# Patient Record
Sex: Female | Born: 1960 | Race: White | Hispanic: No | Smoking: Former smoker
Health system: Southern US, Community
[De-identification: ages and names within clinical notes are randomized; demographics above are authoritative.]

## PROBLEM LIST (undated history)

## (undated) DIAGNOSIS — D649 Anemia, unspecified: Secondary | ICD-10-CM

## (undated) DIAGNOSIS — R011 Cardiac murmur, unspecified: Secondary | ICD-10-CM

## (undated) DIAGNOSIS — K219 Gastro-esophageal reflux disease without esophagitis: Secondary | ICD-10-CM

## (undated) DIAGNOSIS — R112 Nausea with vomiting, unspecified: Secondary | ICD-10-CM

## (undated) DIAGNOSIS — Z9889 Other specified postprocedural states: Secondary | ICD-10-CM

## (undated) DIAGNOSIS — F32A Depression, unspecified: Secondary | ICD-10-CM

## (undated) DIAGNOSIS — F329 Major depressive disorder, single episode, unspecified: Secondary | ICD-10-CM

## (undated) HISTORY — DX: Depression, unspecified: F32.A

## (undated) HISTORY — DX: Gastro-esophageal reflux disease without esophagitis: K21.9

## (undated) HISTORY — DX: Nausea with vomiting, unspecified: R11.2

## (undated) HISTORY — DX: Cardiac murmur, unspecified: R01.1

## (undated) HISTORY — DX: Major depressive disorder, single episode, unspecified: F32.9

## (undated) HISTORY — PX: MYOMECTOMY: SHX85

## (undated) HISTORY — DX: Anemia, unspecified: D64.9

## (undated) HISTORY — PX: SALPINGOOPHORECTOMY: SHX82

## (undated) HISTORY — PX: GYNECOLOGIC CRYOSURGERY: SHX857

## (undated) HISTORY — PX: WISDOM TOOTH EXTRACTION: SHX21

## (undated) HISTORY — DX: Other specified postprocedural states: Z98.890

---

## 2003-04-25 HISTORY — PX: ABDOMINAL HYSTERECTOMY: SHX81

## 2007-03-05 ENCOUNTER — Emergency Department (HOSPITAL_COMMUNITY): Admission: EM | Admit: 2007-03-05 | Discharge: 2007-03-05 | Payer: Self-pay | Admitting: Emergency Medicine

## 2007-06-07 ENCOUNTER — Ambulatory Visit: Payer: Self-pay | Admitting: Sports Medicine

## 2007-06-07 DIAGNOSIS — S86819A Strain of other muscle(s) and tendon(s) at lower leg level, unspecified leg, initial encounter: Secondary | ICD-10-CM

## 2007-06-07 DIAGNOSIS — S838X9A Sprain of other specified parts of unspecified knee, initial encounter: Secondary | ICD-10-CM

## 2007-06-08 DIAGNOSIS — D649 Anemia, unspecified: Secondary | ICD-10-CM

## 2007-08-28 ENCOUNTER — Ambulatory Visit: Payer: Self-pay | Admitting: Sports Medicine

## 2007-08-28 DIAGNOSIS — IMO0002 Reserved for concepts with insufficient information to code with codable children: Secondary | ICD-10-CM

## 2008-08-26 ENCOUNTER — Ambulatory Visit: Payer: Self-pay | Admitting: Sports Medicine

## 2008-08-26 DIAGNOSIS — M2480 Other specific joint derangements of unspecified joint, not elsewhere classified: Secondary | ICD-10-CM

## 2008-08-26 DIAGNOSIS — M79609 Pain in unspecified limb: Secondary | ICD-10-CM

## 2011-01-31 LAB — I-STAT 8, (EC8 V) (CONVERTED LAB)
Acid-base deficit: 2
Glucose, Bld: 87
HCT: 35 — ABNORMAL LOW
TCO2: 24

## 2011-01-31 LAB — CBC
Hemoglobin: 11.8 — ABNORMAL LOW
MCHC: 34.6
Platelets: 258
RBC: 3.5 — ABNORMAL LOW
RDW: 13.5
WBC: 4.8

## 2011-01-31 LAB — DIFFERENTIAL
Eosinophils Absolute: 0 — ABNORMAL LOW
Eosinophils Relative: 0
Lymphs Abs: 1.6
Monocytes Absolute: 0.4
Monocytes Relative: 8
Neutrophils Relative %: 58

## 2011-01-31 LAB — POCT CARDIAC MARKERS
CKMB, poc: <1 — ABNORMAL LOW
Myoglobin, poc: 43.3
Operator id: 272551

## 2011-01-31 LAB — D-DIMER, QUANTITATIVE: D-Dimer, Quant: 0.38

## 2018-04-21 NOTE — Progress Notes (Deleted)
Julie Hopkins is a 57 y.o. female is here to [facility].   No care team member to display   History of Present Illness:   {CMA SCRIBE ATTESTATION}  HPI:  There are no preventive care reminders to display for this patient. No flowsheet data found.  PMHx, SurgHx, SocialHx, Medications, and Allergies were reviewed in the Visit Navigator and updated as appropriate.   No past medical history on file.  *** The histories are not reviewed yet. Please review them in the "History" navigator section and refresh this [place].  No family history on file.  Social History   Tobacco Use  . Smoking status: Not on file  Substance Use Topics  . Alcohol use: Not on file  . Drug use: Not on file    Current Medications and Allergies:  No current outpatient medications on file.  Allergies not on file Review of Systems:   Pertinent items are noted in the HPI. Otherwise, a complete ROS is negative.  Vitals:  There were no vitals filed for this visit.   There is no height or weight on file to calculate BMI.  Physical Exam:   Physical Exam  Results for orders placed or performed during the hospital encounter of 03/05/07  CBC  Result Value Ref Range   WBC      4.8 **Please note change in CBC/Diff reference range**   RBC 3.50 (L)    Hemoglobin 11.8 (L)    HCT 34.1 (L)    MCV 97.4    MCHC 34.6    RDW 13.5    Platelets 258   Differential  Result Value Ref Range   Neutrophils Relative % 58    Neutro Abs 2.8    Lymphocytes Relative 33    Lymphs Abs 1.6    Monocytes Relative 8    Monocytes Absolute 0.4    Eosinophils Relative 0    Eosinophils Absolute 0.0 (L)    Basophils Relative 1    Basophils Absolute 0.0   D-dimer, quantitative  Result Value Ref Range   D-Dimer, Quant      0.38        AT THE INHOUSE ESTABLISHED CUTOFF VALUE OF 0.48 ug/mL FEU, THIS ASSAY HAS BEEN DOCUMENTED IN THE LITERATURE TO HAVE  Cardiac markers, POC  Result Value Ref Range   Operator  id [id number]    Myoglobin, poc 43.3    CKMB, poc <1.0 (L)    Troponin i, poc <0.05   I-STAT creatinine  Result Value Ref Range   Operator id [phone] [id number]    Creatinine, Ser 1.2   I-stat 8 (EC8 V)  Result Value Ref Range   Operator id [id number]    Sodium 142    Potassium 3.8    Chloride 109    BUN 13    Glucose, Bld 87    pH, Ven 7.392 (H)    pCO2, Ven 36.9 (L)    Bicarbonate 22.4    TCO2 24    Acid-base deficit 2.0    HCT 35.0 (L)    Hemoglobin 11.9 (L)    Sample type VENOUS     Assessment and Plan:   There are no diagnoses linked to this encounter.  . Orders and follow up as documented in [place], reviewed diet, exercise and weight control, cardiovascular risk and specific lipid/LDL goals reviewed, reviewed medications and side effects in detail.  . Reviewed expectations re: course of current medical issues. . Outlined signs and symptoms indicating need for  more acute intervention. . Patient verbalized understanding and all questions were answered. . Patient received an After Visit Summary.  *** CMA served as Education administrator during this visit. History, Physical, and Plan performed by medical provider. The above documentation has been reviewed and is accurate and complete. Briscoe Deutscher, D.O.   Briscoe Deutscher, DO [place], [facility] 04/21/2018

## 2018-04-22 ENCOUNTER — Ambulatory Visit: Payer: Self-pay | Admitting: Family Medicine

## 2018-04-28 NOTE — Progress Notes (Signed)
Julie Hopkins is a 58 y.o. female is here to [facility].   Patient Care Team: Briscoe Deutscher, DO as PCP - General (Family Medicine)   History of Present Illness:   HPI:   1. Anxiety. Moved from [place]. Daughter in college. Husband had a liver transplant 2/2 ETOH cirrhosis. Drinking again. Living in [place] while remodeling new home. Able to fall asleep but if she wakes, she cannot go back to sleep due to worrying. Healthy diet. Tries to exercise. Weigh has been stable. No drugs or ETOH.   2. Allergies. Since moving to [place]. Not on treatment. Tried Benadryl - too sedating, Zyrtec - too sedating, Sudafed - palpitations.   3. Hx of ADD. Saw Psychologist. Brought in paperwork and will scan into chart. Wellbutrin has been helpful for this.   Health Maintenance Due  Topic Date Due  . Hepatitis C Screening  02[birth year]02/1959  . HIV Screening  02/21/1974  . TETANUS/TDAP  02/21/1978   No flowsheet data found.  PMHx, SurgHx, SocialHx, Medications, and Allergies were reviewed in the Visit Navigator and updated as appropriate.   Past Medical History:  Diagnosis Date  . Depression   . GERD (gastroesophageal reflux disease)   . Heart murmur   . UTI (urinary tract infection)    Past Surgical History:  Procedure Laterality Date  . ABDOMINAL HYSTERECTOMY  2005   Family History  Problem Relation Age of Onset  . Cancer Mother   . COPD Mother   . Early death Mother   . Heart disease Mother   . Miscarriages / [place] Mother   . Alcohol abuse Father   . Depression Father        "boderline" per pt  . Alcohol abuse Brother   . Early death Brother   . Heart attack Brother   . Hypertension Brother   . Early death Paternal Grandfather   . Depression Daughter   . Alcohol abuse Brother   . Depression Brother   . Heart disease Brother     Social History   Tobacco Use  . Smoking status: Never Smoker  . Smokeless tobacco: Never Used  Substance Use Topics  . Alcohol use: Yes   Alcohol/week: 1.0 - 2.0 standard drinks    Types: 1 - 2 Glasses of wine per week  . Drug use: Never    Current Medications and Allergies:   .  buPROPion (WELLBUTRIN XL) 300 MG 24 hr tablet, Take by mouth., Disp: , Rfl:  .  estradiol (ESTRACE) 1 MG tablet, Take by mouth., Disp: , Rfl:   Allergies  Allergen Reactions  . Sulfa Antibiotics Palpitations    Pt's mother said she was always allergic to it. Never taken med.    Review of Systems:   Pertinent items are noted in the HPI. Otherwise, a complete ROS is negative.  Vitals:   Vitals:   04/29/18 1204  BP: 100/68  Pulse: (!) 59  SpO2: 99%  Weight: 160 lb 9.6 oz (72.8 kg)     Body mass index is 24.42 kg/m.  Physical Exam:   Physical Exam Vitals signs and nursing note reviewed.  HENT:     Head: Normocephalic and atraumatic.  Eyes:     Pupils: Pupils are equal, round, and reactive to light.  Neck:     Musculoskeletal: Normal range of motion and neck supple.  Cardiovascular:     Rate and Rhythm: Normal rate and regular rhythm.     Heart sounds: Normal heart sounds.  Pulmonary:     Effort: Pulmonary effort is normal.  Abdominal:     Palpations: Abdomen is soft.  Skin:    General: Skin is warm.  Psychiatric:        Behavior: Behavior normal.    Assessment and Plan:   [name] was seen today for establish care.  Diagnoses and all orders for this visit:  Depression, recurrent ([place]) -     CBC w/Diff; Future -     COMPLETE METABOLIC PANEL WITH GFR; Future -     Ambulatory referral to Psychology  Hair loss -     B12; Future -     CBC w/Diff; Future -     COMPLETE METABOLIC PANEL WITH GFR; Future -     TSH; Future  Screening for HIV (human immunodeficiency virus) -     HIV Antibody (routine testing w rflx); Future  Need for hepatitis C screening test -     Hepatitis C Antibody; Future  Screening cholesterol level -     Lipid Panel; Future  Need for immunization against influenza -     Flu Vaccine QUAD  36+ mos IM  Primary insomnia -     traZODone (DESYREL) 50 MG tablet; Take 0.5-1 tablets (25-50 mg total) by mouth at bedtime as needed for sleep.  Seasonal allergic rhinitis, unspecified trigger -     fluticasone (FLONASE) 50 MCG/ACT nasal spray; Place 2 sprays into both nostrils daily.  Situational anxiety -     Ambulatory referral to Psychology  Fatigue, unspecified type -     B12; Future -     CBC w/Diff; Future -     COMPLETE METABOLIC PANEL WITH GFR; Future -     TSH; Future    . Orders and follow up as documented in [place], reviewed diet, exercise and weight control, cardiovascular risk and specific lipid/LDL goals reviewed, reviewed medications and side effects in detail.  . Reviewed expectations re: course of current medical issues. . Outlined signs and symptoms indicating need for more acute intervention. . Patient verbalized understanding and all questions were answered. . Patient received an After Visit Summary.  Briscoe Deutscher, DO [place], [place] 04/29/2018

## 2018-04-29 ENCOUNTER — Encounter: Payer: Self-pay | Admitting: Family Medicine

## 2018-04-29 ENCOUNTER — Ambulatory Visit: Payer: Managed Care, Other (non HMO) | Admitting: Family Medicine

## 2018-04-29 VITALS — BP 100/68 | HR 59 | Wt 160.6 lb

## 2018-04-29 DIAGNOSIS — Z114 Encounter for screening for human immunodeficiency virus [HIV]: Secondary | ICD-10-CM | POA: Diagnosis not present

## 2018-04-29 DIAGNOSIS — L659 Nonscarring hair loss, unspecified: Secondary | ICD-10-CM | POA: Insufficient documentation

## 2018-04-29 DIAGNOSIS — F339 Major depressive disorder, recurrent, unspecified: Secondary | ICD-10-CM | POA: Insufficient documentation

## 2018-04-29 DIAGNOSIS — F418 Other specified anxiety disorders: Secondary | ICD-10-CM | POA: Insufficient documentation

## 2018-04-29 DIAGNOSIS — R5383 Other fatigue: Secondary | ICD-10-CM

## 2018-04-29 DIAGNOSIS — Z1159 Encounter for screening for other viral diseases: Secondary | ICD-10-CM | POA: Diagnosis not present

## 2018-04-29 DIAGNOSIS — Z13220 Encounter for screening for lipoid disorders: Secondary | ICD-10-CM

## 2018-04-29 DIAGNOSIS — J302 Other seasonal allergic rhinitis: Secondary | ICD-10-CM | POA: Insufficient documentation

## 2018-04-29 DIAGNOSIS — Z23 Encounter for immunization: Secondary | ICD-10-CM | POA: Diagnosis not present

## 2018-04-29 DIAGNOSIS — F5101 Primary insomnia: Secondary | ICD-10-CM | POA: Insufficient documentation

## 2018-04-29 MED ORDER — TRAZODONE HCL 50 MG PO TABS
25.0000 mg | ORAL_TABLET | Freq: Every evening | ORAL | 3 refills | Status: DC | PRN
Start: 2018-04-29 — End: 2018-10-04

## 2018-04-29 MED ORDER — FLUTICASONE PROPIONATE 50 MCG/ACT NA SUSP: 2.0000 | Freq: Every day | NASAL | 6 refills | Status: DC

## 2018-04-30 ENCOUNTER — Other Ambulatory Visit (INDEPENDENT_AMBULATORY_CARE_PROVIDER_SITE_OTHER): Payer: Managed Care, Other (non HMO)

## 2018-04-30 DIAGNOSIS — Z13220 Encounter for screening for lipoid disorders: Secondary | ICD-10-CM | POA: Diagnosis not present

## 2018-04-30 DIAGNOSIS — F339 Major depressive disorder, recurrent, unspecified: Secondary | ICD-10-CM | POA: Diagnosis not present

## 2018-04-30 DIAGNOSIS — L659 Nonscarring hair loss, unspecified: Secondary | ICD-10-CM | POA: Diagnosis not present

## 2018-04-30 DIAGNOSIS — Z1159 Encounter for screening for other viral diseases: Secondary | ICD-10-CM

## 2018-04-30 DIAGNOSIS — R5383 Other fatigue: Secondary | ICD-10-CM | POA: Diagnosis not present

## 2018-04-30 DIAGNOSIS — Z114 Encounter for screening for human immunodeficiency virus [HIV]: Secondary | ICD-10-CM

## 2018-04-30 LAB — LIPID PANEL
Cholesterol: 238 mg/dL — ABNORMAL HIGH (ref 0–200)
HDL: 80.4 mg/dL (ref >39.00)
LDL Cholesterol: 141 mg/dL — ABNORMAL HIGH (ref 0–99)
NonHDL: 157.61
Total CHOL/HDL Ratio: 3
Triglycerides: 81 mg/dL (ref 0.0–149.0)
VLDL: 16.2 mg/dL (ref 0.0–40.0)

## 2018-04-30 LAB — CBC WITH DIFFERENTIAL/PLATELET
Basophils Absolute: 0 10*3/uL (ref 0.0–0.1)
Basophils Relative: 0.8 % (ref 0.0–3.0)
Eosinophils Absolute: 0 10*3/uL (ref 0.0–0.7)
Eosinophils Relative: 0.7 % (ref 0.0–5.0)
HCT: 39.3 % (ref 36.0–46.0)
Hemoglobin: 13.3 g/dL (ref 12.0–15.0)
Lymphocytes Relative: 33.2 % (ref 12.0–46.0)
Lymphs Abs: 1.1 10*3/uL (ref 0.7–4.0)
MCHC: 33.9 g/dL (ref 30.0–36.0)
MCV: 96.3 fl (ref 78.0–100.0)
Monocytes Absolute: 0.3 10*3/uL (ref 0.1–1.0)
Monocytes Relative: 8.8 % (ref 3.0–12.0)
Neutro Abs: 1.8 10*3/uL (ref 1.4–7.7)
Neutrophils Relative %: 56.5 % (ref 43.0–77.0)
Platelets: 208 10*3/uL (ref 150.0–400.0)
RBC: 4.08 Mil/uL (ref 3.87–5.11)
RDW: 13.1 % (ref 11.5–15.5)
WBC: 3.2 10*3/uL — ABNORMAL LOW (ref 4.0–10.5)

## 2018-04-30 LAB — VITAMIN B12: Vitamin B-12: 309 pg/mL (ref 211–911)

## 2018-04-30 LAB — TSH: TSH: 1.53 u[IU]/mL (ref 0.35–4.50)

## 2018-05-01 ENCOUNTER — Other Ambulatory Visit: Payer: Managed Care, Other (non HMO)

## 2018-05-01 ENCOUNTER — Other Ambulatory Visit: Payer: Self-pay

## 2018-05-01 DIAGNOSIS — D729 Disorder of white blood cells, unspecified: Secondary | ICD-10-CM

## 2018-05-01 LAB — COMPLETE METABOLIC PANEL WITH GFR
AG Ratio: 2 (calc) (ref 1.0–2.5)
ALT: 10 U/L (ref 6–29)
AST: 17 U/L (ref 10–35)
Albumin: 4.1 g/dL (ref 3.6–5.1)
Alkaline phosphatase (APISO): 40 U/L (ref 33–130)
BUN: 13 mg/dL (ref 7–25)
CO2: 29 mmol/L (ref 20–32)
Calcium: 9.1 mg/dL (ref 8.6–10.4)
Chloride: 102 mmol/L (ref 98–110)
Creat: 0.84 mg/dL (ref 0.50–1.05)
GFR, Est African American: 88 mL/min/{1.73_m2} (ref >=60)
GFR, Est Non African American: 76 mL/min/{1.73_m2} (ref >=60)
Globulin: 2.1 g/dL (calc) (ref 1.9–3.7)
Glucose, Bld: 84 mg/dL (ref 65–99)
Potassium: 4.3 mmol/L (ref 3.5–5.3)
Sodium: 137 mmol/L (ref 135–146)
Total Bilirubin: 0.6 mg/dL (ref 0.2–1.2)
Total Protein: 6.2 g/dL (ref 6.1–8.1)

## 2018-05-01 LAB — HEPATITIS C ANTIBODY
Hepatitis C Ab: NONREACTIVE
SIGNAL TO CUT-OFF: 0.01 (ref <1.00)

## 2018-05-01 LAB — HIV ANTIBODY (ROUTINE TESTING W REFLEX): HIV 1&2 Ab, 4th Generation: NONREACTIVE

## 2018-05-02 ENCOUNTER — Telehealth: Payer: Self-pay | Admitting: Family Medicine

## 2018-05-02 NOTE — Telephone Encounter (Signed)
This has been taking care of.

## 2018-05-02 NOTE — Telephone Encounter (Signed)
See note  Copied from [place] [phone] [id number]. Topic: Quick Communication - Lab Results (Clinic Use ONLY) >> May 02, 2018  8:52 AM Scherrie Gerlach wrote: Pt states Julie Hopkins called her and she is returning the call again for lab results.

## 2018-05-28 ENCOUNTER — Telehealth: Payer: Self-pay | Admitting: Family Medicine

## 2018-05-28 NOTE — Telephone Encounter (Signed)
Ok for pt to use same day?  Copied from [place] [id number]. Topic: Appointment Scheduling - Scheduling Inquiry for Clinic >> May 28, 2018  2:06 PM Scherrie Gerlach wrote: Reason for CRM: pt has pain with urination and some pain when she bends over.  Requesting to see Dr Juleen China on Wed.

## 2018-05-28 NOTE — Telephone Encounter (Signed)
Called only number on chart not able to reach patient. L/m on voice mail.

## 2018-05-30 ENCOUNTER — Other Ambulatory Visit (INDEPENDENT_AMBULATORY_CARE_PROVIDER_SITE_OTHER): Payer: Managed Care, Other (non HMO)

## 2018-05-30 DIAGNOSIS — D729 Disorder of white blood cells, unspecified: Secondary | ICD-10-CM | POA: Diagnosis not present

## 2018-05-30 LAB — CBC WITH DIFFERENTIAL/PLATELET
Basophils Absolute: 0 10*3/uL (ref 0.0–0.1)
Basophils Relative: 0.9 % (ref 0.0–3.0)
Eosinophils Absolute: 0.1 10*3/uL (ref 0.0–0.7)
Eosinophils Relative: 3.2 % (ref 0.0–5.0)
HCT: 37.7 % (ref 36.0–46.0)
Hemoglobin: 12.9 g/dL (ref 12.0–15.0)
Lymphocytes Relative: 30.1 % (ref 12.0–46.0)
Lymphs Abs: 1.1 10*3/uL (ref 0.7–4.0)
MCHC: 34.3 g/dL (ref 30.0–36.0)
MCV: 96.2 fl (ref 78.0–100.0)
Monocytes Absolute: 0.5 10*3/uL (ref 0.1–1.0)
Monocytes Relative: 13.2 % — ABNORMAL HIGH (ref 3.0–12.0)
Neutro Abs: 1.8 10*3/uL (ref 1.4–7.7)
Neutrophils Relative %: 52.6 % (ref 43.0–77.0)
Platelets: 211 10*3/uL (ref 150.0–400.0)
RBC: 3.92 Mil/uL (ref 3.87–5.11)
RDW: 12.8 % (ref 11.5–15.5)
WBC: 3.5 10*3/uL — ABNORMAL LOW (ref 4.0–10.5)

## 2018-05-30 NOTE — Telephone Encounter (Signed)
Called patient not able to reach?  

## 2018-05-31 NOTE — Telephone Encounter (Signed)
[place] letter.

## 2018-05-31 NOTE — Telephone Encounter (Signed)
Have called patient x 3 not able to reach ok to mail letter?

## 2018-06-03 ENCOUNTER — Other Ambulatory Visit: Payer: Self-pay | Admitting: *Deleted

## 2018-06-03 DIAGNOSIS — D729 Disorder of white blood cells, unspecified: Secondary | ICD-10-CM

## 2018-06-03 NOTE — Telephone Encounter (Signed)
Spoke to pt told her we have tried to get in touch with her regarding call last week with urinary symptoms. Asked her if she is still having urinary symptoms? Pt said no she is fine, it went away right away, if I develop symptoms again will call. Told pt okay that is fine.

## 2018-06-19 ENCOUNTER — Encounter: Payer: Self-pay | Admitting: Family Medicine

## 2018-06-19 DIAGNOSIS — F339 Major depressive disorder, recurrent, unspecified: Secondary | ICD-10-CM

## 2018-06-19 MED ORDER — BUPROPION HCL ER (XL) 300 MG PO TB24: 300.0000 mg | ORAL_TABLET | Freq: Every day | ORAL | 0 refills | Status: DC

## 2018-06-19 NOTE — Telephone Encounter (Signed)
Dr. Juleen China, pt would like a refill on Wellbutrin XR 300 mg, you have not filled for pt. I have loaded Rx.

## 2018-07-09 ENCOUNTER — Telehealth: Payer: Self-pay | Admitting: Internal Medicine

## 2018-07-09 NOTE — Telephone Encounter (Signed)
A new hem appt has been scheduled for the pt to see Dr. Walden Field on 3/18 at 1050am. She's been made aware to arrive 15 minutes early.

## 2018-07-10 ENCOUNTER — Telehealth: Payer: Self-pay | Admitting: Emergency Medicine

## 2018-07-10 ENCOUNTER — Inpatient Hospital Stay: Payer: Managed Care, Other (non HMO) | Attending: Internal Medicine | Admitting: Internal Medicine

## 2018-07-10 ENCOUNTER — Telehealth: Payer: Self-pay | Admitting: Internal Medicine

## 2018-07-10 ENCOUNTER — Inpatient Hospital Stay: Payer: Managed Care, Other (non HMO)

## 2018-07-10 ENCOUNTER — Other Ambulatory Visit: Payer: Self-pay

## 2018-07-10 VITALS — BP 129/80 | HR 54 | Temp 97.5°F | Resp 17 | Ht 68.0 in | Wt 168.9 lb

## 2018-07-10 DIAGNOSIS — M25551 Pain in right hip: Secondary | ICD-10-CM | POA: Diagnosis not present

## 2018-07-10 DIAGNOSIS — K219 Gastro-esophageal reflux disease without esophagitis: Secondary | ICD-10-CM | POA: Diagnosis not present

## 2018-07-10 DIAGNOSIS — D72818 Other decreased white blood cell count: Secondary | ICD-10-CM | POA: Diagnosis present

## 2018-07-10 DIAGNOSIS — Z79899 Other long term (current) drug therapy: Secondary | ICD-10-CM | POA: Insufficient documentation

## 2018-07-10 DIAGNOSIS — R102 Pelvic and perineal pain: Secondary | ICD-10-CM | POA: Diagnosis not present

## 2018-07-10 LAB — CMP (CANCER CENTER ONLY)
ALT: 18 U/L (ref 0–44)
AST: 19 U/L (ref 15–41)
Albumin: 4.1 g/dL (ref 3.5–5.0)
Alkaline Phosphatase: 60 U/L (ref 38–126)
Anion gap: 9 (ref 5–15)
BUN: 14 mg/dL (ref 6–20)
CO2: 28 mmol/L (ref 22–32)
Calcium: 9 mg/dL (ref 8.9–10.3)
Chloride: 105 mmol/L (ref 98–111)
Creatinine: 0.82 mg/dL (ref 0.44–1.00)
GFR, Est AFR Am: >60 mL/min (ref >60)
GFR, Estimated: >60 mL/min (ref >60)
Glucose, Bld: 82 mg/dL (ref 70–99)
Potassium: 3.9 mmol/L (ref 3.5–5.1)
Sodium: 142 mmol/L (ref 135–145)
Total Bilirubin: 0.4 mg/dL (ref 0.3–1.2)
Total Protein: 7.2 g/dL (ref 6.5–8.1)

## 2018-07-10 LAB — CBC WITH DIFFERENTIAL (CANCER CENTER ONLY)
Abs Immature Granulocytes: 0.01 10*3/uL (ref 0.00–0.07)
BASOS PCT: 1 %
Basophils Absolute: 0 10*3/uL (ref 0.0–0.1)
Eosinophils Absolute: 0.1 10*3/uL (ref 0.0–0.5)
Eosinophils Relative: 1 %
HCT: 38.1 % (ref 36.0–46.0)
Hemoglobin: 12.7 g/dL (ref 12.0–15.0)
IMMATURE GRANULOCYTES: 0 %
Lymphocytes Relative: 32 %
Lymphs Abs: 1.3 10*3/uL (ref 0.7–4.0)
MCH: 32.1 pg (ref 26.0–34.0)
MCHC: 33.3 g/dL (ref 30.0–36.0)
MCV: 96.2 fL (ref 80.0–100.0)
MONOS PCT: 7 %
Monocytes Absolute: 0.3 10*3/uL (ref 0.1–1.0)
NEUTROS PCT: 59 %
Neutro Abs: 2.4 10*3/uL (ref 1.7–7.7)
Platelet Count: 201 10*3/uL (ref 150–400)
RBC: 3.96 MIL/uL (ref 3.87–5.11)
RDW: 12.8 % (ref 11.5–15.5)
WBC Count: 4.1 10*3/uL (ref 4.0–10.5)
nRBC: 0 % (ref 0.0–0.2)

## 2018-07-10 LAB — LACTATE DEHYDROGENASE: LDH: 171 U/L (ref 98–192)

## 2018-07-10 LAB — C-REACTIVE PROTEIN: CRP: <0.8 mg/dL (ref <1.0)

## 2018-07-10 LAB — SEDIMENTATION RATE: Sed Rate: 6 mm/hr (ref 0–22)

## 2018-07-10 NOTE — Telephone Encounter (Signed)
Gave avs and calendar ° °

## 2018-07-10 NOTE — Telephone Encounter (Signed)
Called pt to make sure she was aware of appt today.  Delays in check in at this time.  Pt in lobby waiting to be registered.  MD Higgs made aware.

## 2018-07-10 NOTE — Progress Notes (Signed)
Pt recently traveled to [place] from 3/6 to 07/04/2018.  Asymptomatic.  MD Higgs aware.  Pt given mask.  Alone in room 24 for MD visit.

## 2018-07-10 NOTE — Progress Notes (Signed)
Referring Physician:  Briscoe Deutscher, DO.  [facility]  Diagnosis Other decreased white blood cell (WBC) count - Plan: CBC with Differential ([place]), CMP ([place] only), Sedimentation rate, C-reactive protein, Rheumatoid factor, ANA, IFA (with reflex), SPEP (Serum protein electrophoresis), Ambulatory referral to Gastroenterology, Lactate dehydrogenase (LDH)  Pain in joint involving right pelvic region and thigh - Plan: CBC with Differential ([place]), CMP ([place] only), Sedimentation rate, C-reactive protein, Rheumatoid factor, ANA, IFA (with reflex), SPEP (Serum protein electrophoresis), Ambulatory referral to Gastroenterology, Lactate dehydrogenase (LDH)  Staging Cancer Staging No matching staging information was found for the patient.  Assessment and Plan:  1. Leukopenia.  58 year old female referred for evaluation due to leukopenia.  Pt reports she has been living in hotel with husband during Architect.  She reports some joint problems and in the past saw a rheumatologist.  Pt recently traveled to [place] 3/6 to 3/12.  Pt had labs done 04/30/2018 that showed WBC 3.2 HB 13.3 plts `208,000.  Chemistries WNL with K+ 4.3 Cr 0.84 and normal LFTs.  Labs done 05/30/2018 reviewed and showed WBC 3.5 HB 12.9 plts 211,000.  She denies any fevers, chills, night sweats, diarrhea and has noted no adenopathy.  She denies any sick contacts.  Pt is seen today for consultation due to leukopenia.   Labs done today  07/10/2018 reviewed and showed WBC 4.1 HB 12.7 plts 201,000.  Chemistries WNL with K+ 3.9 Cr 0.82 normal LFTs.  LDH WNL  171.  Counts are WNL.  Suspect normal variant.  Awaiting results of ANA.  If all Labs negative will notify pt and arrange for follow-up in 10/2018  2.  Joint pain.  Pt reportedly was seen by Rheumatologist in the past.  Awaiting results of ANA, RF, SPEP, RF, CRP.  If negative, pt will follow-up in 10/2018.   3.  Health maintenance.  Pt reports  last colonoscopy was done 10 years ago.  She is referred to GI for evaluation.    30 minutes spent with more than 50% spent in review of records, counseling and coordination of care.    HPI:  58 year old female referred for evaluation due to leukopenia.  Pt reports she has been living in hotel with husband during [place].  She reports some joint problems and in the past saw a rheumatologist.  Pt recently traveled to [place] 3/6 to 3/12.  Pt had labs done 04/30/2018 that showed WBC 3.2 HB 13.3 plts `208,000.  Chemistries WNL with K+ 4.3 Cr 0.84 and normal LFTs.  Labs done 05/30/2018 reviewed and showed WBC 3.5 HB 12.9 plts 211,000.  She denies any fevers, chills, night sweats, diarrhea and has noted no adenopathy.  She denies any sick contacts.  Pt is seen today for consultation due to leukopenia.   Problem List Patient Active Problem List   Diagnosis Date Noted  . Depression, recurrent ([place]) [F33.9] 04/29/2018  . Hair loss [L65.9] 04/29/2018  . Primary insomnia [F51.01] 04/29/2018  . Seasonal allergic rhinitis [J30.2] 04/29/2018  . Situational anxiety [F41.8] 04/29/2018    Past Medical History Past Medical History:  Diagnosis Date  . Depression   . GERD (gastroesophageal reflux disease)   . Heart murmur   . UTI (urinary tract infection)     Past Surgical History Past Surgical History:  Procedure Laterality Date  . ABDOMINAL HYSTERECTOMY  2005    Family History Family History  Problem Relation Age of Onset  . Cancer Mother   .  COPD Mother   . Early death Mother   . Heart disease Mother   . Miscarriages / [place] Mother   . Alcohol abuse Father   . Depression Father        "boderline" per pt  . Alcohol abuse Brother   . Early death Brother   . Heart attack Brother   . Hypertension Brother   . Early death Paternal Grandfather   . Depression Daughter   . Alcohol abuse Brother   . Depression Brother   . Heart disease Brother      Social History  reports that she  has never smoked. She has never used smokeless tobacco. She reports current alcohol use of about 1.0 - 2.0 standard drinks of alcohol per week. She reports that she does not use drugs.  Medications  Current Outpatient Medications:  .  buPROPion (WELLBUTRIN XL) 300 MG 24 hr tablet, Take 1 tablet (300 mg total) by mouth daily., Disp: 90 tablet, Rfl: 0 .  estradiol (ESTRACE) 1 MG tablet, Take by mouth., Disp: , Rfl:  .  fluticasone (FLONASE) 50 MCG/ACT nasal spray, Place 2 sprays into both nostrils daily., Disp: 16 g, Rfl: 6 .  traZODone (DESYREL) 50 MG tablet, Take 0.5-1 tablets (25-50 mg total) by mouth at bedtime as needed for sleep., Disp: 30 tablet, Rfl: 3  Allergies Sulfa antibiotics  Review of Systems Review of Systems - Oncology ROS negative other than joint pain   Physical Exam  Vitals Wt Readings from Last 3 Encounters:  07/10/18 168 lb 14.4 oz (76.6 kg)  04/29/18 160 lb 9.6 oz (72.8 kg)   Temp Readings from Last 3 Encounters:  07/10/18 (!) 97.5 F (36.4 C) (Oral)   BP Readings from Last 3 Encounters:  07/10/18 129/80  04/29/18 100/68   Pulse Readings from Last 3 Encounters:  07/10/18 (!) 54  04/29/18 (!) 59   Constitutional: Well-developed, well-nourished, and in no distress.   HENT: Head: Normocephalic and atraumatic.  Mouth/Throat: No oropharyngeal exudate. Mucosa moist. Eyes: Pupils are equal, round, and reactive to light. Conjunctivae are normal. No scleral icterus.  Neck: Normal range of motion. Neck supple. No JVD present.  Cardiovascular: Normal rate, regular rhythm and normal heart sounds.  Exam reveals no gallop and no friction rub.   No murmur heard. Pulmonary/Chest: Effort normal and breath sounds normal. No respiratory distress. No wheezes.No rales.  Abdominal: Soft. Bowel sounds are normal. No distension. There is no tenderness. There is no guarding.  Musculoskeletal: No edema or tenderness.  Lymphadenopathy: No cervical,axillary or supraclavicular  adenopathy.  Neurological: Alert and oriented to person, place, and time. No cranial nerve deficit.  Skin: Skin is warm and dry. No rash noted. No erythema. No pallor.  Psychiatric: Affect and judgment normal.   Labs Appointment on 07/10/2018  Component Date Value Ref Range Status  . LDH 07/10/2018 171  98 - 192 U/L Final   Performed at [facility], [place] [address], [place], [place] [address]  . CRP 07/10/2018 <0.8  <1.0 mg/dL Final   Performed at [place] [address], [place], [place] [address]  . Sed Rate 07/10/2018 6  0 - 22 mm/hr Final   Performed at [facility], [place] [address], [place], [place] [address]  . Sodium 07/10/2018 142  135 - 145 mmol/L Final  . Potassium 07/10/2018 3.9  3.5 - 5.1 mmol/L Final  . Chloride 07/10/2018 105  98 - 111 mmol/L Final  . CO2  07/10/2018 28  22 - 32 mmol/L Final  . Glucose, Bld 07/10/2018 82  70 - 99 mg/dL Final  . BUN 07/10/2018 14  6 - 20 mg/dL Final  . Creatinine 07/10/2018 0.82  0.44 - 1.00 mg/dL Final  . Calcium 07/10/2018 9.0  8.9 - 10.3 mg/dL Final  . Total Protein 07/10/2018 7.2  6.5 - 8.1 g/dL Final  . Albumin 07/10/2018 4.1  3.5 - 5.0 g/dL Final  . AST 07/10/2018 19  15 - 41 U/L Final  . ALT 07/10/2018 18  0 - 44 U/L Final  . Alkaline Phosphatase 07/10/2018 60  38 - 126 U/L Final  . Total Bilirubin 07/10/2018 0.4  0.3 - 1.2 mg/dL Final  . GFR, Est Non Af Am 07/10/2018 >60  >60 mL/min Final  . GFR, Est AFR Am 07/10/2018 >60  >60 mL/min Final  . Anion gap 07/10/2018 9  5 - 15 Final   Performed at [facility], [place] [address] [place], [place] [address]  . WBC Count 07/10/2018 4.1  4.0 - 10.5 K/uL Final  . RBC 07/10/2018 3.96  3.87 - 5.11 MIL/uL Final  . Hemoglobin 07/10/2018 12.7  12.0 - 15.0 g/dL Final  . HCT 07/10/2018 38.1  36.0 - 46.0 % Final  . MCV 07/10/2018 96.2  80.0 - 100.0 fL Final  . MCH 07/10/2018 32.1  26.0 - 34.0 pg Final  .  MCHC 07/10/2018 33.3  30.0 - 36.0 g/dL Final  . RDW 07/10/2018 12.8  11.5 - 15.5 % Final  . Platelet Count 07/10/2018 201  150 - 400 K/uL Final  . nRBC 07/10/2018 0.0  0.0 - 0.2 % Final  . Neutrophils Relative % 07/10/2018 59  % Final  . Neutro Abs 07/10/2018 2.4  1.7 - 7.7 K/uL Final  . Lymphocytes Relative 07/10/2018 32  % Final  . Lymphs Abs 07/10/2018 1.3  0.7 - 4.0 K/uL Final  . Monocytes Relative 07/10/2018 7  % Final  . Monocytes Absolute 07/10/2018 0.3  0.1 - 1.0 K/uL Final  . Eosinophils Relative 07/10/2018 1  % Final  . Eosinophils Absolute 07/10/2018 0.1  0.0 - 0.5 K/uL Final  . Basophils Relative 07/10/2018 1  % Final  . Basophils Absolute 07/10/2018 0.0  0.0 - 0.1 K/uL Final  . Immature Granulocytes 07/10/2018 0  % Final  . Abs Immature Granulocytes 07/10/2018 0.01  0.00 - 0.07 K/uL Final   Performed at [facility], [place] [address] [place], [place] [address]     Pathology Orders Placed This Encounter  Procedures  . CBC with Differential (Cancer Center Only)    Standing Status:   Future    Number of Occurrences:   1    Standing Expiration Date:   07/10/2019  . CMP ([place] only)    Standing Status:   Future    Number of Occurrences:   1    Standing Expiration Date:   07/10/2019  . Sedimentation rate    Standing Status:   Future    Number of Occurrences:   1    Standing Expiration Date:   07/10/2019  . C-reactive protein    Standing Status:   Future    Number of Occurrences:   1    Standing Expiration Date:   07/10/2019  . Rheumatoid factor    Standing Status:   Future    Number of Occurrences:   1    Standing Expiration Date:   07/10/2019  .  ANA, IFA (with reflex)    Standing Status:   Future    Number of Occurrences:   1    Standing Expiration Date:   07/10/2019  . SPEP (Serum protein electrophoresis)    Standing Status:   Future    Number of Occurrences:   1    Standing Expiration Date:   07/10/2019  . Lactate dehydrogenase  (LDH)    Standing Status:   Future    Number of Occurrences:   1    Standing Expiration Date:   07/10/2019  . Ambulatory referral to Gastroenterology    Referral Priority:   Routine    Referral Type:   Consultation    Referral Reason:   Specialty Services Required    Number of Visits Requested:   1       Zoila Shutter MD

## 2018-07-11 LAB — PROTEIN ELECTROPHORESIS, SERUM
A/G Ratio: 1.7 (ref 0.7–1.7)
ALPHA-2-GLOBULIN: 0.6 g/dL (ref 0.4–1.0)
Albumin ELP: 4.1 g/dL (ref 2.9–4.4)
Alpha-1-Globulin: 0.2 g/dL (ref 0.0–0.4)
Beta Globulin: 0.9 g/dL (ref 0.7–1.3)
Gamma Globulin: 0.8 g/dL (ref 0.4–1.8)
Globulin, Total: 2.4 g/dL (ref 2.2–3.9)
Total Protein ELP: 6.5 g/dL (ref 6.0–8.5)

## 2018-07-11 LAB — RHEUMATOID FACTOR: Rheumatoid fact SerPl-aCnc: <10 IU/mL (ref 0.0–13.9)

## 2018-07-11 LAB — ANTINUCLEAR ANTIBODIES, IFA: ANA Ab, IFA: NEGATIVE

## 2018-07-18 ENCOUNTER — Telehealth: Payer: Self-pay | Admitting: *Deleted

## 2018-07-18 NOTE — Telephone Encounter (Signed)
TCT patient to ask her if she would be ok with a phone visit tomorrow 07/19/18 2 9:30 instead of 'in person' visit. No answer but was able to leave VM message for patient to call back in the morning @ 8:30 or so to let us know .

## 2018-07-19 ENCOUNTER — Inpatient Hospital Stay: Payer: Managed Care, Other (non HMO) | Admitting: Internal Medicine

## 2018-07-19 ENCOUNTER — Inpatient Hospital Stay (HOSPITAL_BASED_OUTPATIENT_CLINIC_OR_DEPARTMENT_OTHER): Payer: Managed Care, Other (non HMO) | Admitting: Internal Medicine

## 2018-07-19 ENCOUNTER — Telehealth: Payer: Self-pay | Admitting: Internal Medicine

## 2018-07-19 ENCOUNTER — Other Ambulatory Visit: Payer: Self-pay

## 2018-07-19 DIAGNOSIS — D72819 Decreased white blood cell count, unspecified: Secondary | ICD-10-CM

## 2018-07-19 DIAGNOSIS — M2550 Pain in unspecified joint: Secondary | ICD-10-CM | POA: Diagnosis not present

## 2018-07-19 NOTE — Telephone Encounter (Signed)
Scheduled per [place]. Mailed printout

## 2018-07-19 NOTE — Progress Notes (Signed)
Virtual Visit via Telephone Note  I connected with Julie Hopkins on 07/19/18 at  9:20 AM EDT by telephone and verified that I am speaking with the correct person using two identifiers.  Pt was called by Glyn Ade, RN at [facility] and she transferred the call.     I discussed the limitations, risks, security and privacy concerns of performing an evaluation and management service by telephone and the availability of in person appointments. I also discussed with the patient that there may be a patient responsible charge related to this service. The patient expressed understanding and agreed to proceed.  Pt is at home and consented to the phone visit.   I have called pt from office.     Interval History:  Historical data obtained from noted dated 07/10/2018. HPI:  58 year old female referred for evaluation due to leukopenia.  Pt reports she has been living in hotel with husband during [place].  She reports some joint problems and in the past saw a rheumatologist.  Pt recently traveled to [place] 3/6 to 3/12.  Pt had labs done 04/30/2018 that showed WBC 3.2 HB 13.3 plts `208,000.  Chemistries WNL with K+ 4.3 Cr 0.84 and normal LFTs.  Labs done 05/30/2018 reviewed and showed WBC 3.5 HB 12.9 plts 211,000.  She denies any fevers, chills, night sweats, diarrhea and has noted no adenopathy.  She denies any sick contacts.   Observations/Objective: Review of lab studies.     Assessment and Plan:1. Leukopenia.  58 year old female referred for evaluation due to leukopenia.  Pt reports she has been living in hotel with husband during [place].  She reports some joint problems and in the past saw a rheumatologist.  Pt recently traveled to [place] 3/6 to 3/12.  Pt had labs done 04/30/2018 that showed WBC 3.2 HB 13.3 plts `208,000.  Chemistries WNL with K+ 4.3 Cr 0.84 and normal LFTs.  Labs done 05/30/2018 reviewed and showed WBC 3.5 HB 12.9 plts 211,000.  She denies any fevers, chills, night sweats, diarrhea  and has noted no adenopathy.  She denies any sick contacts.  Pt is seen today for consultation due to leukopenia.   Labs done 07/10/2018 showed WBC 4.1 HB 12.7 plts 201,000.  Chemistries WNL with K+ 3.9 Cr 0.82 normal LFTs.  LDH WNL  171.  Counts are WNL.  Suspect normal variant.  Counts are normal on lab evaluation done 07/10/2018.  Pt will follow-up in 10/2018 with repeat labs.    2.  Joint pain.  Pt reportedly was seen by Rheumatologist in the past.  She has negative ANA, RF, SPEP, RF, CRP.  Follow-up with PCP ongoing symptoms.    3.  Health maintenance.  Pt reports last colonoscopy was done 10 years ago.  She is referred to GI for evaluation.     Follow Up Instructions: Pt will follow-up in 10/2018 with repeat labs.      I discussed the assessment and treatment plan with the patient. The patient was provided an opportunity to ask questions and all were answered. The patient agreed with the plan and demonstrated an understanding of the instructions.   The patient was advised to call back or seek an in-person evaluation if the symptoms worsen or if the condition fails to improve as anticipated.  I provided 10 minutes of non-face-to-face time during this encounter.   Zoila Shutter, MD

## 2018-07-19 NOTE — Progress Notes (Signed)
This encounter was created in error - please disregard.  This encounter was created in error - please disregard.

## 2018-07-19 NOTE — Progress Notes (Signed)
9:30 am TCT patient for Dr. Walden Field to review labs with patient in lieu of person to person visit. Spoke with patient and transferred to Dr. Walden Field

## 2018-07-24 ENCOUNTER — Ambulatory Visit: Payer: Managed Care, Other (non HMO) | Admitting: Internal Medicine

## 2018-07-26 ENCOUNTER — Other Ambulatory Visit: Payer: Self-pay | Admitting: Family Medicine

## 2018-07-26 DIAGNOSIS — F339 Major depressive disorder, recurrent, unspecified: Secondary | ICD-10-CM

## 2018-07-26 MED ORDER — BUPROPION HCL ER (XL) 300 MG PO TB24: 300.0000 mg | ORAL_TABLET | Freq: Every day | ORAL | 0 refills | Status: DC

## 2018-07-30 ENCOUNTER — Telehealth: Payer: Self-pay | Admitting: Family Medicine

## 2018-07-30 NOTE — Telephone Encounter (Signed)
Please call and offer video visit.

## 2018-07-30 NOTE — Telephone Encounter (Signed)
Please call pt and offer virtual visit for medication refill. Has never been filled by Dr. Juleen China.

## 2018-07-30 NOTE — Telephone Encounter (Signed)
Patient left voicemail message to request a refill of estradiol (ESTRACE) 1 MG tablet and states she is almost out of the medication. Medication previously filled by provider in [place]. Pt can be contacted at [phone].

## 2018-07-31 ENCOUNTER — Other Ambulatory Visit: Payer: Self-pay

## 2018-07-31 ENCOUNTER — Encounter: Payer: Self-pay | Admitting: Family Medicine

## 2018-07-31 ENCOUNTER — Ambulatory Visit (INDEPENDENT_AMBULATORY_CARE_PROVIDER_SITE_OTHER): Payer: Managed Care, Other (non HMO) | Admitting: Family Medicine

## 2018-07-31 VITALS — Ht 68.0 in | Wt 161.7 lb

## 2018-07-31 DIAGNOSIS — E2839 Other primary ovarian failure: Secondary | ICD-10-CM | POA: Diagnosis not present

## 2018-07-31 DIAGNOSIS — L659 Nonscarring hair loss, unspecified: Secondary | ICD-10-CM | POA: Diagnosis not present

## 2018-07-31 DIAGNOSIS — Z90710 Acquired absence of both cervix and uterus: Secondary | ICD-10-CM

## 2018-07-31 DIAGNOSIS — Z1231 Encounter for screening mammogram for malignant neoplasm of breast: Secondary | ICD-10-CM | POA: Diagnosis not present

## 2018-07-31 DIAGNOSIS — Z789 Other specified health status: Secondary | ICD-10-CM

## 2018-07-31 MED ORDER — ESTRADIOL 1 MG PO TABS: 1.0000 mg | ORAL_TABLET | Freq: Every day | ORAL | 1 refills | Status: DC

## 2018-07-31 NOTE — Progress Notes (Signed)
Virtual Visit via Video   I connected with Julie Hopkins on 07/31/18 at 10:00 AM EDT by a video enabled telemedicine application and verified that I am speaking with the correct person using two identifiers. Location patient: Home Location provider:  [facility], Office Persons participating in the virtual visit: Julie Hopkins, Walder, DO Julie Hopkins, CMA acting as scribe for Dr. Briscoe Deutscher.   I discussed the limitations of evaluation and management by telemedicine and the availability of in person appointments. The patient expressed understanding and agreed to proceed.  Subjective:   HPI: Would like refill on Estrace. She has been on that for two years. She feels like it is controlling symptoms very well. She is due for mammogram. We have put that order in for her. She will get call for scheduling when they open back up after Covid-19    1. Anxiety. Moved from [place]. Daughter in college. Husband had a liver transplant 2/2 ETOH cirrhosis. Drinking again. Living in [place] while remodeling new home. Able to fall asleep but if she wakes, she cannot go back to sleep due to worrying. Healthy diet. Tries to exercise. Weigh has been stable. No drugs or ETOH.   2. Allergies. Since moving to [place]. Not on treatment. Tried Benadryl - too sedating, Zyrtec - too sedating, Sudafed - palpitations.   3. Hx of ADD. Saw Psychologist. Brought in paperwork and will scan into chart. Wellbutrin has been helpful for this.   4. Abnormal CBC. Seen by hematology. Labs were all stable. She has follow up with them on 11/20/2018  Reviewed all precautions and expectations with prevention of Covid-19. ROS: See pertinent positives and negatives per HPI.  Patient Active Problem List   Diagnosis Date Noted  . Depression, recurrent ([place]) 04/29/2018  . Hair loss 04/29/2018  . Primary insomnia 04/29/2018  . Seasonal allergic rhinitis 04/29/2018  . Situational anxiety 04/29/2018    Social History     Tobacco Use  . Smoking status: Never Smoker  . Smokeless tobacco: Never Used  Substance Use Topics  . Alcohol use: Yes    Alcohol/week: 1.0 - 2.0 standard drinks    Types: 1 - 2 Glasses of wine per week    Current Outpatient Medications:  .  buPROPion (WELLBUTRIN XL) 300 MG 24 hr tablet, Take 1 tablet (300 mg total) by mouth daily., Disp: 90 tablet, Rfl: 0 .  estradiol (ESTRACE) 1 MG tablet, Take 1 tablet (1 mg total) by mouth daily., Disp: 90 tablet, Rfl: 1 .  fluticasone (FLONASE) 50 MCG/ACT nasal spray, Place 2 sprays into both nostrils daily., Disp: 16 g, Rfl: 6 .  traZODone (DESYREL) 50 MG tablet, Take 0.5-1 tablets (25-50 mg total) by mouth at bedtime as needed for sleep., Disp: 30 tablet, Rfl: 3  Allergies  Allergen Reactions  . Sulfa Antibiotics Palpitations    Pt's mother said she was always allergic to it. Never taken med.     Objective:   VITALS: Per patient if applicable, see vitals. GENERAL: Alert, appears well and in no acute distress. HEENT: Atraumatic, conjunctiva clear, no obvious abnormalities on inspection of external nose and ears. NECK: Normal movements of the head and neck. CARDIOPULMONARY: No increased WOB. Speaking in clear sentences. I:E ratio WNL.  MS: Moves all visible extremities without noticeable abnormality. PSYCH: Pleasant and cooperative, well-groomed. Speech normal rate and rhythm. Affect is appropriate. Insight and judgement are appropriate. Attention is focused, linear, and appropriate.  NEURO: CN grossly intact. Oriented as arrived to  appointment on time with no prompting. Moves both UE equally.  SKIN: No obvious lesions, wounds, erythema, or cyanosis noted on face or hands.  Assessment and Plan:   [name] was seen today for follow-up.  Diagnoses and all orders for this visit:  Estrogen deficiency -     estradiol (ESTRACE) 1 MG tablet; Take 1 tablet (1 mg total) by mouth daily.  Hair loss -     estradiol (ESTRACE) 1 MG tablet; Take 1  tablet (1 mg total) by mouth daily.  Screening mammogram, encounter for -     MM 3D SCREEN BREAST BILATERAL; Future  History of total hysterectomy  No family history of breast cancer    . Reviewed expectations re: course of current medical issues. . Discussed self-management of symptoms. . Outlined signs and symptoms indicating need for more acute intervention. . Patient verbalized understanding and all questions were answered. Marland Kitchen Health Maintenance issues including appropriate healthy diet, exercise, and smoking avoidance were discussed with patient. . See orders for this visit as documented in the electronic medical record.  Briscoe Deutscher, DO 07/31/2018

## 2018-09-18 ENCOUNTER — Telehealth: Payer: Self-pay | Admitting: Family Medicine

## 2018-09-18 DIAGNOSIS — F339 Major depressive disorder, recurrent, unspecified: Secondary | ICD-10-CM

## 2018-09-18 MED ORDER — BUPROPION HCL ER (XL) 300 MG PO TB24: 300.0000 mg | ORAL_TABLET | Freq: Every day | ORAL | 0 refills | Status: DC

## 2018-09-18 NOTE — Telephone Encounter (Signed)
Last OV 07/31/18 Last refill 07/26/18 #90/1 Next OV 10/01/18

## 2018-09-18 NOTE — Telephone Encounter (Signed)
Copied from [place] [id number]. Topic: Quick Communication - Rx Refill/Question >> Sep 18, 2018  1:55 PM Leward Quan A wrote: Medication: buPROPion (WELLBUTRIN XL) 300 MG 24 hr tablet   Has the patient contacted their pharmacy? Yes.   (Agent: If no, request that the patient contact the pharmacy for the refill.) (Agent: If yes, when and what did the pharmacy advise?)  Preferred Pharmacy (with phone number or street name): [id number] - [place], [place] [phone] (Phone) [phone] (Fax)    Agent: Please be advised that RX refills may take up to 3 business days. We ask that you follow-up with your pharmacy.

## 2018-09-18 NOTE — Telephone Encounter (Signed)
See request °

## 2018-10-01 ENCOUNTER — Encounter: Payer: Self-pay | Admitting: Family Medicine

## 2018-10-01 ENCOUNTER — Ambulatory Visit (INDEPENDENT_AMBULATORY_CARE_PROVIDER_SITE_OTHER): Payer: Managed Care, Other (non HMO) | Admitting: Family Medicine

## 2018-10-01 ENCOUNTER — Other Ambulatory Visit: Payer: Self-pay

## 2018-10-01 ENCOUNTER — Telehealth: Payer: Self-pay | Admitting: General Surgery

## 2018-10-01 VITALS — BP 120/76 | HR 59 | Temp 98.1°F | Ht 67.0 in | Wt 171.0 lb

## 2018-10-01 DIAGNOSIS — Z23 Encounter for immunization: Secondary | ICD-10-CM

## 2018-10-01 DIAGNOSIS — Z0000 Encounter for general adult medical examination without abnormal findings: Secondary | ICD-10-CM | POA: Diagnosis not present

## 2018-10-01 DIAGNOSIS — Z13220 Encounter for screening for lipoid disorders: Secondary | ICD-10-CM

## 2018-10-01 DIAGNOSIS — R5383 Other fatigue: Secondary | ICD-10-CM

## 2018-10-01 NOTE — Telephone Encounter (Signed)
Patient unable to talk at this time, asked if we would call back after 3:00.

## 2018-10-01 NOTE — Progress Notes (Signed)
I acted as a Education administrator for [employer], DO Anselmo Pickler, LPN   Subjective:    Julie Hopkins is a 58 y.o. female and is here for a comprehensive physical exam.  Health Maintenance Due  Topic Date Due  . MAMMOGRAM  05/24/2018    Current Outpatient Medications:  .  BIOTIN PO, Take 1 tablet by mouth daily., Disp: , Rfl:  .  buPROPion (WELLBUTRIN XL) 300 MG 24 hr tablet, Take 1 tablet (300 mg total) by mouth daily., Disp: 90 tablet, Rfl: 0 .  estradiol (ESTRACE) 1 MG tablet, Take 1 tablet (1 mg total) by mouth daily., Disp: 90 tablet, Rfl: 1 .  fluticasone (FLONASE) 50 MCG/ACT nasal spray, Place 2 sprays into both nostrils daily., Disp: 16 g, Rfl: 6 .  Multiple Vitamin (MULTIVITAMIN) tablet, Take 1 tablet by mouth daily., Disp: , Rfl:  .  traZODone (DESYREL) 50 MG tablet, Take 0.5-1 tablets (25-50 mg total) by mouth at bedtime as needed for sleep., Disp: 30 tablet, Rfl: 3  PMHx, SurgHx, SocialHx, Medications, and Allergies were reviewed in the Visit Navigator and updated as appropriate.   Past Medical History:  Diagnosis Date  . Depression   . GERD (gastroesophageal reflux disease)   . Heart murmur      Past Surgical History:  Procedure Laterality Date  . ABDOMINAL HYSTERECTOMY  2005     Family History  Problem Relation Age of Onset  . Cancer Mother   . COPD Mother   . Early death Mother   . Heart disease Mother   . Miscarriages / [place] Mother   . Alcohol abuse Father   . Depression Father   . Alcohol abuse Brother   . Early death Brother   . Heart attack Brother   . Hypertension Brother   . Early death Paternal Grandfather   . Depression Daughter   . Alcohol abuse Brother   . Depression Brother   . Heart disease Brother     Social History   Tobacco Use  . Smoking status: Never Smoker  . Smokeless tobacco: Never Used  Substance Use Topics  . Alcohol use: Yes    Alcohol/week: 1.0 - 2.0 standard drinks    Types: 1 - 2 Glasses of wine per week  .  Drug use: Never    Review of Systems:   Pertinent items are noted in the HPI. Otherwise, ROS is negative.  Objective:   BP 120/76 (BP Location: Left Arm, Patient Position: Sitting, Cuff Size: Normal)   Pulse (!) 59   Temp 98.1 F (36.7 C) (Oral)   Ht 5\' 7"  (1.702 m)   Wt 171 lb (77.6 kg)   SpO2 98%   BMI 26.78 kg/m   General appearance: alert, cooperative and appears stated age. Head: normocephalic, without obvious abnormality, atraumatic. Neck: no adenopathy, supple, symmetrical, trachea midline; thyroid not enlarged, symmetric, no tenderness/mass/nodules. Lungs: clear to auscultation bilaterally. Heart: regular rate and rhythm Abdomen: soft, non-tender; no masses,  no organomegaly. Extremities: extremities normal, atraumatic, no cyanosis or edema. Skin: skin color, texture, turgor normal, no rashes or lesions. Lymph: cervical, supraclavicular, and axillary nodes normal; no abnormal inguinal nodes palpated. Neurologic: grossly normal.  Assessment/Plan:   Julie Hopkins was seen today for annual exam.  Diagnoses and all orders for this visit:  Routine physical examination  Other fatigue -     CBC with Differential/Platelet -     Comprehensive metabolic panel -     TSH  Need for Tdap vaccination -  Tdap vaccine greater than or equal to 7yo IM  Screening mammogram, encounter for  Screening for lipid disorders -     Lipid panel   Patient Counseling: [x]    Nutrition: Stressed importance of moderation in sodium/caffeine intake, saturated fat and cholesterol, caloric balance, sufficient intake of fresh fruits, vegetables, fiber, calcium, iron, and 1 mg of folate supplement per day (for females capable of pregnancy).  [x]    Stressed the importance of regular exercise.   [x]    Substance Abuse: Discussed cessation/primary prevention of tobacco, alcohol, or other drug use; driving or other dangerous activities under the influence; availability of treatment for abuse.   [x]     Injury prevention: Discussed safety belts, safety helmets, smoke detector, smoking near bedding or upholstery.   [x]    Sexuality: Discussed sexually transmitted diseases, partner selection, use of condoms, avoidance of unintended pregnancy  and contraceptive alternatives.  [x]    Dental health: Discussed importance of regular tooth brushing, flossing, and dental visits.  [x]    Health maintenance and immunizations reviewed. Please refer to Health maintenance section.   Briscoe Deutscher, DO [place]

## 2018-10-02 ENCOUNTER — Encounter: Payer: Self-pay | Admitting: Family Medicine

## 2018-10-02 ENCOUNTER — Encounter: Payer: Self-pay | Admitting: General Surgery

## 2018-10-02 LAB — CBC WITH DIFFERENTIAL/PLATELET
Basophils Absolute: 0 10*3/uL (ref 0.0–0.1)
Basophils Relative: 0.8 % (ref 0.0–3.0)
Eosinophils Absolute: 0 10*3/uL (ref 0.0–0.7)
Eosinophils Relative: 0.6 % (ref 0.0–5.0)
HCT: 36.4 % (ref 36.0–46.0)
Hemoglobin: 12.4 g/dL (ref 12.0–15.0)
Lymphocytes Relative: 35.4 % (ref 12.0–46.0)
Lymphs Abs: 1.6 10*3/uL (ref 0.7–4.0)
MCHC: 34 g/dL (ref 30.0–36.0)
MCV: 96.3 fl (ref 78.0–100.0)
Monocytes Absolute: 0.3 10*3/uL (ref 0.1–1.0)
Monocytes Relative: 7.6 % (ref 3.0–12.0)
Neutro Abs: 2.5 10*3/uL (ref 1.4–7.7)
Neutrophils Relative %: 55.6 % (ref 43.0–77.0)
Platelets: 206 10*3/uL (ref 150.0–400.0)
RBC: 3.78 Mil/uL — ABNORMAL LOW (ref 3.87–5.11)
RDW: 13.2 % (ref 11.5–15.5)
WBC: 4.5 10*3/uL (ref 4.0–10.5)

## 2018-10-02 LAB — COMPREHENSIVE METABOLIC PANEL
ALT: 11 U/L (ref 0–35)
AST: 18 U/L (ref 0–37)
Albumin: 4.1 g/dL (ref 3.5–5.2)
Alkaline Phosphatase: 42 U/L (ref 39–117)
BUN: 19 mg/dL (ref 6–23)
CO2: 30 mEq/L (ref 19–32)
Calcium: 9 mg/dL (ref 8.4–10.5)
Chloride: 104 mEq/L (ref 96–112)
Creatinine, Ser: 0.77 mg/dL (ref 0.40–1.20)
GFR: 76.57 mL/min (ref >60.00)
Glucose, Bld: 80 mg/dL (ref 70–99)
Potassium: 4.1 mEq/L (ref 3.5–5.1)
Sodium: 139 mEq/L (ref 135–145)
Total Bilirubin: 0.4 mg/dL (ref 0.2–1.2)
Total Protein: 6.2 g/dL (ref 6.0–8.3)

## 2018-10-02 LAB — LIPID PANEL
Cholesterol: 232 mg/dL — ABNORMAL HIGH (ref 0–200)
HDL: 72.2 mg/dL (ref >39.00)
LDL Cholesterol: 137 mg/dL — ABNORMAL HIGH (ref 0–99)
NonHDL: 159.93
Total CHOL/HDL Ratio: 3
Triglycerides: 115 mg/dL (ref 0.0–149.0)
VLDL: 23 mg/dL (ref 0.0–40.0)

## 2018-10-02 LAB — TSH: TSH: 0.83 u[IU]/mL (ref 0.35–4.50)

## 2018-10-04 ENCOUNTER — Encounter: Payer: Self-pay | Admitting: Gastroenterology

## 2018-10-04 ENCOUNTER — Ambulatory Visit (INDEPENDENT_AMBULATORY_CARE_PROVIDER_SITE_OTHER): Payer: Managed Care, Other (non HMO) | Admitting: Gastroenterology

## 2018-10-04 ENCOUNTER — Other Ambulatory Visit: Payer: Self-pay

## 2018-10-04 VITALS — Ht 67.0 in | Wt 170.0 lb

## 2018-10-04 DIAGNOSIS — R0989 Other specified symptoms and signs involving the circulatory and respiratory systems: Secondary | ICD-10-CM | POA: Diagnosis not present

## 2018-10-04 DIAGNOSIS — Z1211 Encounter for screening for malignant neoplasm of colon: Secondary | ICD-10-CM | POA: Diagnosis not present

## 2018-10-04 MED ORDER — PANTOPRAZOLE SODIUM 40 MG PO TBEC: 40.0000 mg | DELAYED_RELEASE_TABLET | Freq: Every day | ORAL | 3 refills | Status: DC

## 2018-10-04 MED ORDER — NA SULFATE-K SULFATE-MG SULF 17.5-3.13-1.6 GM/177ML PO SOLN: 1.0000 | ORAL | 0 refills | Status: AC

## 2018-10-04 NOTE — Progress Notes (Signed)
TELEHEALTH VISIT  Referring Provider: Zoila Shutter, MD Primary Care Physician:  Briscoe Deutscher, DO   Tele-visit due to COVID-19 pandemic Patient requested visit virtually, consented to the virtual encounter via video enabled telemedicine application (Zoom) Contact made at: 08:32 10/04/18 Patient verified by name and date of birth Location of patient: Home Location provider: [facility] Names of persons participating: Me, patient, Tinnie Gens CMA Time spent on telehealth visit:  I discussed the limitations of evaluation and management by telemedicine. The patient expressed understanding and agreed to proceed.  Reason for Consultation:  Leukopenia, dysphagia   IMPRESSION:  Globus with intermittent dysphagia Normal screening colonoscopy in [place] 10 years ago No known family history of colon cancer or polyps  Globus without associated pain, lateralization of the symptoms, dysphagia, odynophagia, weight loss, a change in voice, presence of a neck or tonsillar mass, or unexplained cervical adenopathy.  EGD recommended to evaluate for gastroesophageal reflux, gastric inlet pouch in the proximal esophagus, and eosinophilic esophagitis as the cause of symptoms.  If upper endoscopy is negative would consider esophageal manometry, esophageal impedance, and pH testing to exclude esophageal motility disorders.  Trial of daily PPI therapy. Consider amitriptyline 25 mg daily if no response to PPI.  Proceed with colonoscopy for colon cancer screening.   PLAN: Pantoprazole 40 mg daily x 8 weeks Work to maintain a healthy weight Colonoscopy and EGD  I consented the patient discussing the risks, benefits, and alternatives to endoscopic evaluation. In particular, we discussed the risks that include, but are not limited to, reaction to medication, cardiopulmonary compromise, bleeding requiring blood transfusion, aspiration resulting in pneumonia, perforation requiring surgery, lack  of diagnosis, severe illness requiring hospitalization, and even death. We reviewed the risk of missed lesion including polyps or even cancer. The patient acknowledges these risks and asks that we proceed.  HPI: LEON GOODNOW is a 58 y.o. woman referred by Dr. Walden Field. The history is obtained through the patient and review of her electronic health record.  She has a history of situational anxiety, depression, seasonal allergies, ADD. She has recently been under evaluation by hematology for leukopenia. Dr. Walden Field suggested that she needed a colonoscopy.   Last colonoscopy at age 31 in [place]. Normal exam. No complications.   Several months of intermittent feeling like a marble at the sternal notch with associated dysphagia. Less frequent now, but, still present. There is no heartburn or regurgitation. There is no pain, lateralization of the symptoms, odynophagia, dysphonia, or weight loss. She has gained 10 since March. There is no history of radiation to the head neck, smoking or chewing tobacco. Rare alcohol. No other associated symptoms. No identified exacerbating or relieving features.   Dr. Juleen China suspected it could be acid reflux.  She already follows dietary recommendations with reflux. She has been on medications in the past for a short period of time but not recently. She had found that dietary changes made the biggest difference in controlling her symptoms.   No change in bowel habits. No blood in the stool. GI ROS is otherwise negative.   No known family history of colon cancer or polyps. Paternal grandmother with uterine cancer in her 67s. No other family history of uterine/endometrial cancer, pancreatic cancer or gastric/stomach cancer.  Past Medical History:  Diagnosis Date  . Depression   . GERD (gastroesophageal reflux disease)   . Heart murmur     Past Surgical History:  Procedure Laterality Date  . ABDOMINAL HYSTERECTOMY  2005    Current  Outpatient Medications   Medication Sig Dispense Refill  . BIOTIN PO Take 1 tablet by mouth daily.    Marland Kitchen buPROPion (WELLBUTRIN XL) 300 MG 24 hr tablet Take 1 tablet (300 mg total) by mouth daily. 90 tablet 0  . estradiol (ESTRACE) 1 MG tablet Take 1 tablet (1 mg total) by mouth daily. 90 tablet 1  . fluticasone (FLONASE) 50 MCG/ACT nasal spray Place 2 sprays into both nostrils daily. 16 g 6  . Multiple Vitamin (MULTIVITAMIN) tablet Take 1 tablet by mouth daily.     No current facility-administered medications for this visit.     Allergies as of 10/04/2018 - Review Complete 10/04/2018  Allergen Reaction Noted  . Sulfa antibiotics Palpitations 05/04/2017    Family History  Problem Relation Age of Onset  . Cancer Mother   . COPD Mother   . Early death Mother   . Heart disease Mother   . Miscarriages / [place] Mother   . Alcohol abuse Father   . Depression Father   . Alcohol abuse Brother   . Early death Brother   . Heart attack Brother   . Hypertension Brother   . Early death Paternal Grandfather   . Depression Daughter   . Alcohol abuse Brother   . Depression Brother   . Heart disease Brother   . Colon cancer Neg Hx   . Pancreatic cancer Neg Hx   . Rectal cancer Neg Hx   . Stomach cancer Neg Hx     Social History   Socioeconomic History  . Marital status: Married    Spouse name: Asjah Rauda   . Number of children: 1  . Years of education: Not on file  . Highest education level: Not on file  Occupational History  . Occupation: Retired  Scientific laboratory technician  . Financial resource strain: Not on file  . Food insecurity    Worry: Not on file    Inability: Not on file  . Transportation needs    Medical: Not on file    Non-medical: Not on file  Tobacco Use  . Smoking status: Never Smoker  . Smokeless tobacco: Never Used  Substance and Sexual Activity  . Alcohol use: Yes    Alcohol/week: 1.0 - 2.0 standard drinks    Types: 1 - 2 Glasses of wine per week  . Drug use: Never  . Sexual  activity: Not on file  Lifestyle  . Physical activity    Days per week: Not on file    Minutes per session: Not on file  . Stress: Not on file  Relationships  . Social Herbalist on phone: Not on file    Gets together: Not on file    Attends religious service: Not on file    Active member of club or organization: Not on file    Attends meetings of clubs or organizations: Not on file    Relationship status: Not on file  . Intimate partner violence    Fear of current or ex partner: Not on file    Emotionally abused: Not on file    Physically abused: Not on file    Forced sexual activity: Not on file  Other Topics Concern  . Not on file  Social History Narrative  . Not on file    Review of Systems: ALL ROS discussed and all others negative except listed in HPI.  Physical Exam: General: in no acute distress Neuro: Alert and appropriate Psych: Normal affect and normal  insight   Aydenn Gervin L. Tarri Glenn, MD, MPH [facility] 10/04/2018, 8:33 AM

## 2018-10-04 NOTE — Patient Instructions (Signed)
I have recommended 40 mg of pantoprazole daily. Take the pantoprazole 30 minutes before meals. Avoid any dietary triggers that you've identified. Avoid spicy and acidic foods. Limit your intake of coffee, tea, alcohol, and carbonated drinks. Work to maintain a healthy weight. Keep the head of the bed elevated with blocks if you are having any nighttime symptoms. Stay upright for 2 hours after eating. Avoid meals and snacks three to four hours before bedtime  I have recommended a colonoscopy and upper endoscopy.  Please call with any questions prior to your procedures.

## 2018-10-07 ENCOUNTER — Ambulatory Visit
Admission: RE | Admit: 2018-10-07 | Discharge: 2018-10-07 | Disposition: A | Discharge status: Home / Self Care | Payer: Managed Care, Other (non HMO) | Source: Ambulatory Visit | Attending: Family Medicine | Admitting: Family Medicine

## 2018-10-07 ENCOUNTER — Other Ambulatory Visit: Payer: Self-pay

## 2018-10-07 DIAGNOSIS — Z1231 Encounter for screening mammogram for malignant neoplasm of breast: Secondary | ICD-10-CM

## 2018-10-11 ENCOUNTER — Encounter: Payer: Self-pay | Admitting: Family Medicine

## 2018-10-21 ENCOUNTER — Ambulatory Visit: Payer: Managed Care, Other (non HMO) | Admitting: Internal Medicine

## 2018-10-21 ENCOUNTER — Other Ambulatory Visit: Payer: Managed Care, Other (non HMO)

## 2018-10-30 ENCOUNTER — Encounter: Payer: Self-pay | Admitting: Family Medicine

## 2018-10-30 DIAGNOSIS — M79673 Pain in unspecified foot: Secondary | ICD-10-CM

## 2018-10-31 ENCOUNTER — Ambulatory Visit (INDEPENDENT_AMBULATORY_CARE_PROVIDER_SITE_OTHER): Payer: Managed Care, Other (non HMO)

## 2018-10-31 ENCOUNTER — Ambulatory Visit (INDEPENDENT_AMBULATORY_CARE_PROVIDER_SITE_OTHER): Payer: Managed Care, Other (non HMO) | Admitting: Family Medicine

## 2018-10-31 ENCOUNTER — Other Ambulatory Visit: Payer: Self-pay

## 2018-10-31 ENCOUNTER — Encounter: Payer: Self-pay | Admitting: Family Medicine

## 2018-10-31 DIAGNOSIS — M79671 Pain in right foot: Secondary | ICD-10-CM

## 2018-10-31 NOTE — Progress Notes (Signed)
Office Visit Note   Patient: Julie Hopkins           Date of Birth: 1961/04/13           MRN: [id number] Visit Date: 10/31/2018 Requested by: Briscoe Deutscher, [place],  [place] [address] PCP: Briscoe Deutscher, DO  Subjective: Chief Complaint  Patient presents with  . Right Foot - Pain    Pain plantar aspect of foot. Is a runner/walker, but has not run for the past couple months. Has seen a doctor at [place]. Helped with the "thickened skin" on the foot. But has pain in the 2nd toe, lateral foot & ball of foot.    HPI: She is here with right foot pain.  Symptoms started about 2 or 3 months ago.  She was out on her usual walk/jog and started feeling pain on the plantar aspect of her forefoot.  Within a couple days she felt like there was glass in her skin on the plantar aspect of the fourth/fifth MTP joints.  She tried to find it, but did not find anything.  She then went to a podiatrist about 7 weeks ago who debrided the area but it did not provide complete relief of pain.  She took x-rays around that time which were reportedly negative.  Now her pain is most intense underneath the second and third MTP joints.  It hurts with any weightbearing.  Her podiatrist saw her again and told her that her toe was dislocated and might need surgery.  Patient has had a bone density test which was normal.  There has been no change in her running/walking routine or mileage.  She did purchase new shoes about 6 months ago and is not happy with them.  Her old shoe model is no longer made.                ROS: Denies fevers or chills.  All other systems were reviewed and are negative.  Objective: Vital Signs: There were no vitals taken for this visit.  Physical Exam:  General:  Alert and oriented, in no acute distress. Pulm:  Breathing unlabored. Psy:  Normal mood, congruent affect. Skin: No rash on her skin.  She has hypertrophic callus underneath the fifth MTP joint. Right  foot: She has collapse of the transverse arch of the forefoot compared to the left.  Normal longitudinal arch.  Slight soft tissue swelling over the second MTP joint, no erythema or warmth.  Flexion and extension of the toes actively is normal.  Palpation of the second MTP joint feels normal, I do not believe the joint is subluxed/dislocated.  She is tender to palpation at the distal second and third metatarsals and directly under the second and third MTP joints.   Imaging: X-rays right foot: She has slight flattening of the second metatarsal head concerning for Freiberg's disease.  No obvious stress fracture seen.  There are calcific densities on either side of the second MTP joint on the oblique view but they are not readily visible on the other 2 views.  No sign of dislocation.  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    Assessment & Plan: 1.  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 -MRI to evaluate.  Depending on results, we might have her get fitted for new running shoes and possibly custom orthotics.     Procedures: No procedures performed  No notes on file  PMFS History: Patient Active Problem List   Diagnosis Date Noted  . Depression, recurrent ([place]) 04/29/2018  . Hair loss 04/29/2018  . Primary insomnia 04/29/2018  . Seasonal allergic rhinitis 04/29/2018  . Situational anxiety 04/29/2018   Past Medical History:  Diagnosis Date  . Depression   . GERD (gastroesophageal reflux disease)   . Heart murmur     Family History  Problem Relation Age of Onset  . Cancer Mother   . COPD Mother   . Early death Mother   . Heart disease Mother   . Miscarriages / [place] Mother   . Alcohol abuse Father   . Depression Father   . Alcohol abuse Brother   . Early death Brother   . Heart attack Brother   . Hypertension Brother   . Early death Paternal Grandfather   . Depression Daughter   . Alcohol  abuse Brother   . Depression Brother   . Heart disease Brother   . Colon cancer Neg Hx   . Pancreatic cancer Neg Hx   . Rectal cancer Neg Hx   . Stomach cancer Neg Hx     Past Surgical History:  Procedure Laterality Date  . ABDOMINAL HYSTERECTOMY  2005   Social History   Occupational History  . Occupation: Retired  Tobacco Use  . Smoking status: Never Smoker  . Smokeless tobacco: Never Used  Substance and Sexual Activity  . Alcohol use: Yes    Alcohol/week: 1.0 - 2.0 standard drinks    Types: 1 - 2 Glasses of wine per week  . Drug use: Never  . Sexual activity: Not on file

## 2018-11-07 ENCOUNTER — Telehealth: Payer: Self-pay | Admitting: Gastroenterology

## 2018-11-07 NOTE — Telephone Encounter (Signed)
Patient called back and answer no to all questions

## 2018-11-07 NOTE — Telephone Encounter (Signed)

## 2018-11-08 ENCOUNTER — Encounter: Payer: Self-pay | Admitting: Gastroenterology

## 2018-11-08 ENCOUNTER — Other Ambulatory Visit: Payer: Self-pay

## 2018-11-08 ENCOUNTER — Ambulatory Visit (AMBULATORY_SURGERY_CENTER): Payer: Managed Care, Other (non HMO) | Admitting: Gastroenterology

## 2018-11-08 VITALS — BP 117/64 | HR 50 | Temp 98.7°F | Resp 19

## 2018-11-08 DIAGNOSIS — K449 Diaphragmatic hernia without obstruction or gangrene: Secondary | ICD-10-CM

## 2018-11-08 DIAGNOSIS — K635 Polyp of colon: Secondary | ICD-10-CM | POA: Diagnosis not present

## 2018-11-08 DIAGNOSIS — R0989 Other specified symptoms and signs involving the circulatory and respiratory systems: Secondary | ICD-10-CM

## 2018-11-08 DIAGNOSIS — R1310 Dysphagia, unspecified: Secondary | ICD-10-CM

## 2018-11-08 DIAGNOSIS — D128 Benign neoplasm of rectum: Secondary | ICD-10-CM | POA: Diagnosis not present

## 2018-11-08 DIAGNOSIS — Z1211 Encounter for screening for malignant neoplasm of colon: Secondary | ICD-10-CM

## 2018-11-08 DIAGNOSIS — D125 Benign neoplasm of sigmoid colon: Secondary | ICD-10-CM

## 2018-11-08 DIAGNOSIS — K3189 Other diseases of stomach and duodenum: Secondary | ICD-10-CM | POA: Diagnosis not present

## 2018-11-08 DIAGNOSIS — R198 Other specified symptoms and signs involving the digestive system and abdomen: Secondary | ICD-10-CM

## 2018-11-08 DIAGNOSIS — K2270 Barrett's esophagus without dysplasia: Secondary | ICD-10-CM

## 2018-11-08 DIAGNOSIS — R09A2 Foreign body sensation, throat: Secondary | ICD-10-CM

## 2018-11-08 HISTORY — PX: UPPER GASTROINTESTINAL ENDOSCOPY: SHX188

## 2018-11-08 HISTORY — PX: COLONOSCOPY: SHX174

## 2018-11-08 MED ORDER — SODIUM CHLORIDE 0.9 % IV SOLN: 500.0000 mL | Freq: Once | INTRAVENOUS | Status: DC

## 2018-11-08 NOTE — Progress Notes (Signed)
A and O x3. Report to RN. Tolerated MAC anesthesia well.Teeth unchanged after procedure.

## 2018-11-08 NOTE — Patient Instructions (Signed)
Handouts Provided:  Polyps and Diverticulosis  YOU HAD AN ENDOSCOPIC PROCEDURE TODAY AT [facility]:   Refer to the procedure report that was given to you for any specific questions about what was found during the examination.  If the procedure report does not answer your questions, please call your gastroenterologist to clarify.  If you requested that your care partner not be given the details of your procedure findings, then the procedure report has been included in a sealed envelope for you to review at your convenience later.  YOU SHOULD EXPECT: Some feelings of bloating in the abdomen. Passage of more gas than usual.  Walking can help get rid of the air that was put into your GI tract during the procedure and reduce the bloating. If you had a lower endoscopy (such as a colonoscopy or flexible sigmoidoscopy) you may notice spotting of blood in your stool or on the toilet paper. If you underwent a bowel prep for your procedure, you may not have a normal bowel movement for a few days.  Please Note:  You might notice some irritation and congestion in your nose or some drainage.  This is from the oxygen used during your procedure.  There is no need for concern and it should clear up in a day or so.  SYMPTOMS TO REPORT IMMEDIATELY:   Following lower endoscopy (colonoscopy or flexible sigmoidoscopy):  Excessive amounts of blood in the stool  Significant tenderness or worsening of abdominal pains  Swelling of the abdomen that is new, acute  Fever of 100F or higher   Following upper endoscopy (EGD)  Vomiting of blood or coffee ground material  New chest pain or pain under the shoulder blades  Painful or persistently difficult swallowing  New shortness of breath  Fever of 100F or higher  Black, tarry-looking stools  For urgent or emergent issues, a gastroenterologist can be reached at any hour by calling [phone].   DIET:  We do recommend a small meal at first, but  then you may proceed to your regular diet.  Drink plenty of fluids but you should avoid alcoholic beverages for 24 hours.  ACTIVITY:  You should plan to take it easy for the rest of today and you should NOT DRIVE or use heavy machinery until tomorrow (because of the sedation medicines used during the test).    FOLLOW UP: Our staff will call the number listed on your records 48-72 hours following your procedure to check on you and address any questions or concerns that you may have regarding the information given to you following your procedure. If we do not reach you, we will leave a message.  We will attempt to reach you two times.  During this call, we will ask if you have developed any symptoms of COVID 19. If you develop any symptoms (ie: fever, flu-like symptoms, shortness of breath, cough etc.) before then, please call [phone].  If you test positive for Covid 19 in the 2 weeks post procedure, please call and report this information to [place].    If any biopsies were taken you will be contacted by phone or by letter within the next 1-3 weeks.  Please call us at [phone] if you have not heard about the biopsies in 3 weeks.    SIGNATURES/CONFIDENTIALITY: You and/or your care partner have signed paperwork which will be entered into your electronic medical record.  These signatures attest to the fact that that the information above on your After  Visit Summary has been reviewed and is understood.  Full responsibility of the confidentiality of this discharge information lies with you and/or your care-partner.

## 2018-11-08 NOTE — Op Note (Signed)
[facility] Patient Name: Julie Hopkins Procedure Date: 11/08/2018 9:22 AM MRN: [id number] Endoscopist: Thornton Park MD, MD Age: 58 Referring MD:  Date of Birth: [birth year] Gender: Female Account #: [id number] Procedure:                Upper GI endoscopy Indications:              Globus with intermittent dysphagia Medicines:                See the Anesthesia note for documentation of the                            administered medications Procedure:                Pre-Anesthesia Assessment:                           - Prior to the procedure, a History and Physical                            was performed, and patient medications and                            allergies were reviewed. The patient's tolerance of                            previous anesthesia was also reviewed. The risks                            and benefits of the procedure and the sedation                            options and risks were discussed with the patient.                            All questions were answered, and informed consent                            was obtained. Prior Anticoagulants: The patient has                            taken no previous anticoagulant or antiplatelet                            agents. ASA Grade Assessment: II - A patient with                            mild systemic disease. After reviewing the risks                            and benefits, the patient was deemed in                            satisfactory condition to undergo the procedure.  After obtaining informed consent, the endoscope was                            passed under direct vision. Throughout the                            procedure, the patient's blood pressure, pulse, and                            oxygen saturations were monitored continuously. The                            Endoscope was introduced through the mouth, and                            advanced to the  third part of duodenum. The upper                            GI endoscopy was accomplished without difficulty.                            The patient tolerated the procedure well. Scope In: Scope Out: Findings:                 The Z-line was irregular. Biopsies were taken with                            a cold forceps for histology. Estimated blood loss                            was minimal.                           Diffuse mildly erythematous mucosa without bleeding                            was found in the gastric body. Biopsies were taken                            with a cold forceps for histology.                           A small hiatal hernia was present.                           The examined duodenum was normal.                           The cardia and gastric fundus were normal on                            retroflexion.                           The exam was otherwise without abnormality. Complications:  No immediate complications. Estimated blood loss:                            Minimal. Estimated Blood Loss:     Estimated blood loss was minimal. Impression:               - Z-line irregular. Biopsied.                           - Mild erythematous mucosa in the gastric body.                            Biopsied.                           - Small hiatal hernia.                           - Normal examined duodenum.                           - The examination was otherwise normal. Recommendation:           - Patient has a contact number available for                            emergencies. The signs and symptoms of potential                            delayed complications were discussed with the                            patient. Return to normal activities tomorrow.                            Written discharge instructions were provided to the                            patient.                           - Resume regular diet today.                           -  Continue present medications including                            pantoprazole 40 mg daily.                           - Await pathology results.                           - Follow-up in the office in 2-3 weeks to review                            these results and proceed with further evaluation  as needed. Thornton Park MD, MD 11/08/2018 10:04:05 AM This report has been signed electronically.

## 2018-11-08 NOTE — Op Note (Signed)
[facility] Patient Name: Julie Hopkins Procedure Date: 11/08/2018 9:22 AM MRN: [id number] Endoscopist: Thornton Park MD, MD Age: 58 Referring MD:  Date of Birth: [birth year] Gender: Female Account #: [id number] Procedure:                Colonoscopy Indications:              Screening for colorectal malignant neoplasm                           Normal screening colonoscopy in [place] 10 years ago                           No known family history of colon cancer or polyps Medicines:                See the Anesthesia note for documentation of the                            administered medications Procedure:                Pre-Anesthesia Assessment:                           - Prior to the procedure, a History and Physical                            was performed, and patient medications and                            allergies were reviewed. The patient's tolerance of                            previous anesthesia was also reviewed. The risks                            and benefits of the procedure and the sedation                            options and risks were discussed with the patient.                            All questions were answered, and informed consent                            was obtained. Prior Anticoagulants: The patient has                            taken no previous anticoagulant or antiplatelet                            agents. ASA Grade Assessment: II - A patient with                            mild systemic disease. After reviewing the risks  and benefits, the patient was deemed in                            satisfactory condition to undergo the procedure.                           After obtaining informed consent, the colonoscope                            was passed under direct vision. Throughout the                            procedure, the patient's blood pressure, pulse, and                            oxygen  saturations were monitored continuously. The                            Colonoscope was introduced through the anus and                            advanced to the the terminal ileum, with                            identification of the appendiceal orifice and IC                            valve. A second forward view of the right colon was                            performed. The colonoscopy was performed without                            difficulty. The patient tolerated the procedure                            well. The quality of the bowel preparation was                            good. The terminal ileum, ileocecal valve,                            appendiceal orifice, and rectum were photographed. Scope In: 9:41:04 AM Scope Out: 9:57:06 AM Scope Withdrawal Time: 0 hours 12 minutes 37 seconds  Total Procedure Duration: 0 hours 16 minutes 2 seconds  Findings:                 The perianal and digital rectal examinations were                            normal.                           Multiple small and large-mouthed diverticula were  found in the sigmoid colon and descending colon.                           A 2 mm polyp was found in the distal sigmoid colon.                            The polyp was sessile. The polyp was removed with a                            cold snare. Resection and retrieval were complete.                            Estimated blood loss was minimal.                           A 4 mm polyp was found in the rectum. The polyp was                            semi-pedunculated. The polyp was removed with a                            cold snare. Resection and retrieval were complete.                            Estimated blood loss was minimal.                           The exam was otherwise without abnormality on                            direct and retroflexion views. Complications:            No immediate complications. Estimated blood  loss:                            Minimal. Estimated Blood Loss:     Estimated blood loss was minimal. Impression:               - Diverticulosis in the sigmoid colon and in the                            descending colon.                           - One 2 mm polyp in the distal sigmoid colon,                            removed with a cold snare. Resected and retrieved.                           - One 4 mm polyp in the rectum, removed with a cold                            snare. Resected  and retrieved.                           - The examination was otherwise normal on direct                            and retroflexion views. Recommendation:           - Patient has a contact number available for                            emergencies. The signs and symptoms of potential                            delayed complications were discussed with the                            patient. Return to normal activities tomorrow.                            Written discharge instructions were provided to the                            patient.                           - High fiber diet today.                           - Continue present medications.                           - Await pathology results.                           - Repeat colonoscopy in 7 years for surveillance if                            at least one polyp is an adenoma. Otherwise, repeat                            colonoscopy in 10 years, or earlier with the                            development of any new symptoms. Thornton Park MD, MD 11/08/2018 10:08:41 AM This report has been signed electronically.

## 2018-11-11 ENCOUNTER — Encounter: Payer: Self-pay | Admitting: *Deleted

## 2018-11-12 ENCOUNTER — Telehealth: Payer: Self-pay

## 2018-11-12 NOTE — Telephone Encounter (Signed)
  Follow up Call-  Call back number 11/08/2018  Post procedure Call Back phone  # [phone]  Permission to leave phone message Yes  Some recent data might be hidden     Patient questions:  Do you have a fever, pain , or abdominal swelling? No. Pain Score  0 *  Have you tolerated food without any problems? Yes.    Have you been able to return to your normal activities? Yes.    Do you have any questions about your discharge instructions: Diet   No. Medications  No. Follow up visit  No.  Do you have questions or concerns about your Care? No.  Actions: * If pain score is 4 or above: 1. No action needed, pain <4.Have you developed a fever since your procedure? no  2.   Have you had an respiratory symptoms (SOB or cough) since your procedure? no  3.   Have you tested positive for COVID 19 since your procedure no  4.   Have you had any family members/close contacts diagnosed with the COVID 19 since your procedure?  no   If yes to any of these questions please route to Joylene John, RN and Alphonsa Gin, Therapist, sports.

## 2018-11-13 NOTE — Progress Notes (Signed)
TELEHEALTH VISIT  Referring Provider: Briscoe Deutscher, DO Primary Care Physician:  Briscoe Deutscher, DO   Tele-visit due to COVID-19 pandemic Patient requested visit virtually, consented to the virtual encounter via video enabled telemedicine application (Zoom) Contact made at: 11/14/2018 and 13:00 Patient verified by name and date of birth Location of patient: Home Location provider: [facility] Names of persons participating: Me, patient, Tinnie Gens CMA Time spent on telehealth visit: 20 minutes I discussed the limitations of evaluation and management by telemedicine. The patient expressed understanding and agreed to proceed.  Reason for Consultation:  Leukopenia, dysphagia   IMPRESSION:  Globus with intermittent dysphagia, improved on daily PPI therapy ? Short segment Barrett's esophagus on recent EGD Small hiatal hernia History of colon polyp    - normal screening colonoscopy in [place] >10 years ago    - history of rectal tubular adenoma and hyperplastic polyp on colonoscopy 11/08/18    - surveillance colonoscopy recommended 2027 Former smoker - quit smoking in 1999 No known family history of colon cancer or polyps  Globus has improved on daily PPI.  We discussed the long-term risks of PPI therapy.  Review the new diagnosis of Barrett's Esophagus. Will plan repeat EGD with Barrett's biopsies 6 months after successful treatment of possible concurrent reflux esophagitis to confirm the diagnosis. Her Barrett's Esophagus was so short that surveillance recommendations are not entirely clear. Hopefully, we can clarify on her next endoscopy with more extensive biopsies.    Reviewed the finding of a small hiatal hernia.  Questions were answered to the patient's satisfaction.  Reviewed the results of her colon polyps. I have recommended another colonoscopy in 7 years given the tubular adenoma.   PLAN: Continue pantoprazole 40 mg daily until the time of the EGD  Reviewed reflux lifestyle modifications Avoid all NSAIDs  EGD in 6 months for Barrett's confirmation and biopsies Surveillance colonoscopy in 7 years   HPI: Julie Hopkins is a 58 y.o. woman referred by Dr. Walden Field. The interval history is obtained through the patient and review of her electronic health record.  She has a history of situational anxiety, depression, seasonal allergies, ADD. She has recently been under evaluation by hematology for leukopenia. Dr. Walden Field suggested that she needed a colonoscopy.   Colonoscopy revealed left-sided diverticulosis, a small rectal tubular adenoma, and a small rectal hyperplastic polyp.  EGD revealed an irregular Z line with biopsy showing focal intestinal metaplasia, a small hiatal hernia, and H. pylori negative mild reactive gastropathy.  Esophageal biopsies were negative for eosinophilic esophagitis.  Her globus has improved on pantoprazole 40 mg daily.  No ongoing dysphasia.  She is carefully identifying food triggers and believes that diet Coke may worsen her symptoms. Rare alcohol. No other associated symptoms. No identified exacerbating or relieving features.    Past Medical History:  Diagnosis Date  . Depression   . GERD (gastroesophageal reflux disease)   . Heart murmur     Past Surgical History:  Procedure Laterality Date  . ABDOMINAL HYSTERECTOMY  2005  . MYOMECTOMY    . SALPINGOOPHORECTOMY     both at 2 different surgeries  . WISDOM TOOTH EXTRACTION      Current Outpatient Medications  Medication Sig Dispense Refill  . BIOTIN PO Take 1 tablet by mouth daily.    Marland Kitchen buPROPion (WELLBUTRIN XL) 300 MG 24 hr tablet Take 1 tablet (300 mg total) by mouth daily. 90 tablet 0  . estradiol (ESTRACE) 1 MG tablet Take 1 tablet (1 mg  total) by mouth daily. 90 tablet 1  . fluticasone (FLONASE) 50 MCG/ACT nasal spray Place 2 sprays into both nostrils daily. (Patient not taking: Reported on 11/08/2018) 16 g 6  . loratadine (CLARITIN) 10 MG  tablet Take 10 mg by mouth daily.    . Multiple Vitamin (MULTIVITAMIN) tablet Take 1 tablet by mouth daily.    . pantoprazole (PROTONIX) 40 MG tablet Take 1 tablet (40 mg total) by mouth daily. 90 tablet 3   No current facility-administered medications for this visit.     Allergies as of 11/14/2018 - Review Complete 11/08/2018  Allergen Reaction Noted  . Sulfa antibiotics Palpitations 05/04/2017    Family History  Problem Relation Age of Onset  . Cancer Mother   . COPD Mother   . Early death Mother   . Heart disease Mother   . Miscarriages / [place] Mother   . Alcohol abuse Father   . Depression Father   . Alcohol abuse Brother   . Early death Brother   . Heart attack Brother   . Hypertension Brother   . Early death Paternal Grandfather   . Depression Daughter   . Alcohol abuse Brother   . Depression Brother   . Heart disease Brother   . Colon cancer Neg Hx   . Pancreatic cancer Neg Hx   . Rectal cancer Neg Hx   . Stomach cancer Neg Hx     Social History   Socioeconomic History  . Marital status: Married    Spouse name: Teresha Hanks   . Number of children: 1  . Years of education: Not on file  . Highest education level: Not on file  Occupational History  . Occupation: Retired  Scientific laboratory technician  . Financial resource strain: Not on file  . Food insecurity    Worry: Not on file    Inability: Not on file  . Transportation needs    Medical: Not on file    Non-medical: Not on file  Tobacco Use  . Smoking status: Former Smoker    Types: Cigarettes    Quit date: 02/07/1998    Years since quitting: 20.7  . Smokeless tobacco: Never Used  Substance and Sexual Activity  . Alcohol use: Yes    Alcohol/week: 1.0 - 2.0 standard drinks    Types: 1 - 2 Glasses of wine per week  . Drug use: Never  . Sexual activity: Not on file  Lifestyle  . Physical activity    Days per week: Not on file    Minutes per session: Not on file  . Stress: Not on file  Relationships   . Social Herbalist on phone: Not on file    Gets together: Not on file    Attends religious service: Not on file    Active member of club or organization: Not on file    Attends meetings of clubs or organizations: Not on file    Relationship status: Not on file  . Intimate partner violence    Fear of current or ex partner: Not on file    Emotionally abused: Not on file    Physically abused: Not on file    Forced sexual activity: Not on file  Other Topics Concern  . Not on file  Social History Narrative  . Not on file    Physical Exam: General: in no acute distress Neuro: Alert and appropriate Psych: Normal affect and normal insight   Damauri Minion L. Tarri Glenn, MD, MPH [facility]  11/13/2018, 11:50 PM

## 2018-11-14 ENCOUNTER — Ambulatory Visit (INDEPENDENT_AMBULATORY_CARE_PROVIDER_SITE_OTHER): Payer: Managed Care, Other (non HMO) | Admitting: Gastroenterology

## 2018-11-14 ENCOUNTER — Encounter: Payer: Self-pay | Admitting: Gastroenterology

## 2018-11-14 DIAGNOSIS — K2270 Barrett's esophagus without dysplasia: Secondary | ICD-10-CM | POA: Diagnosis not present

## 2018-11-14 DIAGNOSIS — R0989 Other specified symptoms and signs involving the circulatory and respiratory systems: Secondary | ICD-10-CM | POA: Diagnosis not present

## 2018-11-14 NOTE — Patient Instructions (Signed)
I am delighted that your symptoms are improving on daily pantoprazole.  The biopsies from your esophagus showed Barrett's esophagus. I recommend visiting the [email or web address] website for accurate information about Barrett's esophagus.  Please continue to take the pantoprazole every day. I recommend taking it 30-60 minutes prior to breakfast.  I do not think there is any benefit to twice daily dosing at this time.  I recommend another EGD with repeat biopsies of your esophagus to confirm the diagnosis of Barrett's in 6 months. Sometimes esophagus and look so similar to Barrett's under the microscope that we need to confirm the diagnosis.   1 precancerous polyp was removed during your colonoscopy.  Therefore, I recommend another colonoscopy in 7 years.  I look forward to seeing you in 6 months at the time of your and upper endoscopy.  Please call me with any questions or concerns in the meantime.

## 2018-11-20 ENCOUNTER — Inpatient Hospital Stay: Payer: Managed Care, Other (non HMO) | Attending: Internal Medicine

## 2018-11-20 ENCOUNTER — Other Ambulatory Visit: Payer: Self-pay

## 2018-11-20 ENCOUNTER — Inpatient Hospital Stay (HOSPITAL_BASED_OUTPATIENT_CLINIC_OR_DEPARTMENT_OTHER): Payer: Managed Care, Other (non HMO) | Admitting: Internal Medicine

## 2018-11-20 VITALS — BP 126/72 | HR 64 | Temp 98.4°F | Resp 18 | Ht 67.0 in | Wt 172.9 lb

## 2018-11-20 DIAGNOSIS — F329 Major depressive disorder, single episode, unspecified: Secondary | ICD-10-CM

## 2018-11-20 DIAGNOSIS — D72819 Decreased white blood cell count, unspecified: Secondary | ICD-10-CM | POA: Insufficient documentation

## 2018-11-20 DIAGNOSIS — Z79899 Other long term (current) drug therapy: Secondary | ICD-10-CM | POA: Diagnosis not present

## 2018-11-20 DIAGNOSIS — F17210 Nicotine dependence, cigarettes, uncomplicated: Secondary | ICD-10-CM

## 2018-11-20 DIAGNOSIS — D72818 Other decreased white blood cell count: Secondary | ICD-10-CM

## 2018-11-20 LAB — CMP (CANCER CENTER ONLY)
ALT: 17 U/L (ref 0–44)
AST: 21 U/L (ref 15–41)
Albumin: 3.9 g/dL (ref 3.5–5.0)
Alkaline Phosphatase: 45 U/L (ref 38–126)
Anion gap: 7 (ref 5–15)
BUN: 12 mg/dL (ref 6–20)
CO2: 29 mmol/L (ref 22–32)
Calcium: 9.6 mg/dL (ref 8.9–10.3)
Chloride: 106 mmol/L (ref 98–111)
Creatinine: 0.83 mg/dL (ref 0.44–1.00)
GFR, Est AFR Am: >60 mL/min (ref >60)
GFR, Estimated: >60 mL/min (ref >60)
Glucose, Bld: 90 mg/dL (ref 70–99)
Potassium: 4.4 mmol/L (ref 3.5–5.1)
Sodium: 142 mmol/L (ref 135–145)
Total Bilirubin: 0.4 mg/dL (ref 0.3–1.2)
Total Protein: 6.8 g/dL (ref 6.5–8.1)

## 2018-11-20 LAB — CBC WITH DIFFERENTIAL (CANCER CENTER ONLY)
Abs Immature Granulocytes: 0.01 10*3/uL (ref 0.00–0.07)
Basophils Absolute: 0 10*3/uL (ref 0.0–0.1)
Basophils Relative: 1 %
Eosinophils Absolute: 0 10*3/uL (ref 0.0–0.5)
Eosinophils Relative: 1 %
HCT: 37.2 % (ref 36.0–46.0)
Hemoglobin: 12.6 g/dL (ref 12.0–15.0)
Immature Granulocytes: 0 %
Lymphocytes Relative: 35 %
Lymphs Abs: 1.2 10*3/uL (ref 0.7–4.0)
MCH: 32.1 pg (ref 26.0–34.0)
MCHC: 33.9 g/dL (ref 30.0–36.0)
MCV: 94.7 fL (ref 80.0–100.0)
Monocytes Absolute: 0.3 10*3/uL (ref 0.1–1.0)
Monocytes Relative: 9 %
Neutro Abs: 1.9 10*3/uL (ref 1.7–7.7)
Neutrophils Relative %: 54 %
Platelet Count: 206 10*3/uL (ref 150–400)
RBC: 3.93 MIL/uL (ref 3.87–5.11)
RDW: 12.9 % (ref 11.5–15.5)
WBC Count: 3.5 10*3/uL — ABNORMAL LOW (ref 4.0–10.5)
nRBC: 0 % (ref 0.0–0.2)

## 2018-11-20 LAB — LACTATE DEHYDROGENASE: LDH: 164 U/L (ref 98–192)

## 2018-11-20 NOTE — Progress Notes (Signed)
Diagnosis Other decreased white blood cell (WBC) count - Plan: CBC with Differential ([place] Only), CMP ([place] only), Lactate dehydrogenase (LDH)  Staging Cancer Staging No matching staging information was found for the patient.  Assessment and Plan:  1. Leukopenia.  58 year old female referred for evaluation due to leukopenia.  Pt reports she has been living in hotel with husband during Architect.  She reports some joint problems and in the past saw a rheumatologist.  Pt recently traveled to [place] 3/6 to 3/12.  Pt had labs done 04/30/2018 that showed WBC 3.2 HB 13.3 plts `208,000.  Chemistries WNL with K+ 4.3 Cr 0.84 and normal LFTs.  Labs done 05/30/2018 reviewed and showed WBC 3.5 HB 12.9 plts 211,000.  She denies any fevers, chills, night sweats, diarrhea and has noted no adenopathy.  She denies any sick contacts.   Labs done today  07/10/2018 reviewed and showed WBC 4.1 HB 12.7 plts 201,000.  Chemistries WNL with K+ 3.9 Cr 0.82 normal LFTs.  LDH WNL  171.  Counts are WNL.  She has normal ANA, RF.   Suspect normal variant.  Pt should follow-up with PCP as directed.  No follow-up at [facility].  Pt can be referred back if any change in blood work.    2.  Joint pain.  Pt reportedly was seen by Rheumatologist in the past.  She has negative ANA, RF, SPEP, RF, CRP.  Follow-up with PCP or Rheumatology as directed.    3.  Health maintenance.  Pt reports last colonoscopy was done 10 years ago.  She was referred to GI for evaluation.  Pt had EGD and colonoscopy done 11/08/2018 that showed  - Diverticulosis in the sigmoid colon and in the descending colon. - One 2 mm polyp in the distal sigmoid colon, removed with a cold snare. Resected and retrieved. - One 4 mm polyp in the rectum, removed with a cold snare. Resected and retrieved.  Pathology returned as hyperplastic polyp and tubular adenoma.    EGD showed hiatal hernia and barrett"s esophagus.  She is scheduled for repeat EGD in 6 months.   Pt should follow-up with GI as directed.    15  minutes spent with more than 50% spent in review of records, counseling and coordination of care.    Interval History:  Historical data obtained from note dated 07/10/2018.  58 year old female referred for evaluation due to leukopenia.  Pt reports she has been living in hotel with husband during [place].  She reports some joint problems and in the past saw a rheumatologist.  Pt recently traveled to [place] 3/6 to 3/12.  Pt had labs done 04/30/2018 that showed WBC 3.2 HB 13.3 plts `208,000.  Chemistries WNL with K+ 4.3 Cr 0.84 and normal LFTs.  Labs done 05/30/2018 reviewed and showed WBC 3.5 HB 12.9 plts 211,000.  She denies any fevers, chills, night sweats, diarrhea and has noted no adenopathy.  She denies any sick contacts.   Current Status:  Pt is seen today for follow-up.  She is here to go over labs.  She had recent GI evaluation.    Oncology History   No history exists.     Problem List Patient Active Problem List   Diagnosis Date Noted  . Depression, recurrent ([place]) [F33.9] 04/29/2018  . Hair loss [L65.9] 04/29/2018  . Primary insomnia [F51.01] 04/29/2018  . Seasonal allergic rhinitis [J30.2] 04/29/2018  . Situational anxiety [F41.8] 04/29/2018    Past Medical History Past Medical History:  Diagnosis Date  . Depression   . GERD (gastroesophageal reflux disease)   . Heart murmur     Past Surgical History Past Surgical History:  Procedure Laterality Date  . ABDOMINAL HYSTERECTOMY  2005  . MYOMECTOMY    . SALPINGOOPHORECTOMY     both at 2 different surgeries  . WISDOM TOOTH EXTRACTION      Family History Family History  Problem Relation Age of Onset  . Cancer Mother   . COPD Mother   . Early death Mother   . Heart disease Mother   . Miscarriages / [place] Mother   . Alcohol abuse Father   . Depression Father   . Alcohol abuse Brother   . Early death Brother   . Heart attack Brother   . Hypertension Brother    . Early death Paternal Grandfather   . Depression Daughter   . Alcohol abuse Brother   . Depression Brother   . Heart disease Brother   . Colon cancer Neg Hx   . Pancreatic cancer Neg Hx   . Rectal cancer Neg Hx   . Stomach cancer Neg Hx      Social History  reports that she quit smoking about 20 years ago. Her smoking use included cigarettes. She has never used smokeless tobacco. She reports current alcohol use of about 1.0 - 2.0 standard drinks of alcohol per week. She reports that she does not use drugs.  Medications  Current Outpatient Medications:  .  BIOTIN PO, Take 1 tablet by mouth daily., Disp: , Rfl:  .  buPROPion (WELLBUTRIN XL) 300 MG 24 hr tablet, Take 1 tablet (300 mg total) by mouth daily., Disp: 90 tablet, Rfl: 0 .  estradiol (ESTRACE) 1 MG tablet, Take 1 tablet (1 mg total) by mouth daily., Disp: 90 tablet, Rfl: 1 .  loratadine (CLARITIN) 10 MG tablet, Take 10 mg by mouth daily., Disp: , Rfl:  .  Multiple Vitamin (MULTIVITAMIN) tablet, Take 1 tablet by mouth daily., Disp: , Rfl:  .  pantoprazole (PROTONIX) 40 MG tablet, Take 1 tablet (40 mg total) by mouth daily., Disp: 90 tablet, Rfl: 3  Allergies Sulfa antibiotics  Review of Systems Review of Systems - Oncology ROS negative   Physical Exam  Vitals Wt Readings from Last 3 Encounters:  11/20/18 172 lb 14.4 oz (78.4 kg)  10/04/18 170 lb (77.1 kg)  10/02/18 170 lb (77.1 kg)   Temp Readings from Last 3 Encounters:  11/20/18 98.4 F (36.9 C) (Oral)  11/08/18 98.7 F (37.1 C)  10/01/18 98.1 F (36.7 C) (Oral)   BP Readings from Last 3 Encounters:  11/20/18 126/72  11/08/18 117/64  10/01/18 120/76   Pulse Readings from Last 3 Encounters:  11/20/18 64  11/08/18 (!) 50  10/01/18 (!) 59   Constitutional: Well-developed, well-nourished, and in no distress.   HENT: Head: Normocephalic and atraumatic.  Mouth/Throat: No oropharyngeal exudate. Mucosa moist. Eyes: Pupils are equal, round, and reactive  to light. Conjunctivae are normal. No scleral icterus.  Neck: Normal range of motion. Neck supple. No JVD present.  Cardiovascular: Normal rate, regular rhythm and normal heart sounds.  Exam reveals no gallop and no friction rub.   No murmur heard. Pulmonary/Chest: Effort normal and breath sounds normal. No respiratory distress. No wheezes.No rales.  Abdominal: Soft. Bowel sounds are normal. No distension. There is no tenderness. There is no guarding.  Musculoskeletal: No edema or tenderness.  Lymphadenopathy: No cervical, axillary or supraclavicular adenopathy.  Neurological: Alert and oriented  to person, place, and time. No cranial nerve deficit.  Skin: Skin is warm and dry. No rash noted. No erythema. No pallor.  Psychiatric: Affect and judgment normal.   Labs Appointment on 11/20/2018  Component Date Value Ref Range Status  . LDH 11/20/2018 164  98 - 192 U/L Final   Performed at [facility], [place] [address] [place], [place] [address]  . Sodium 11/20/2018 142  135 - 145 mmol/L Final  . Potassium 11/20/2018 4.4  3.5 - 5.1 mmol/L Final  . Chloride 11/20/2018 106  98 - 111 mmol/L Final  . CO2 11/20/2018 29  22 - 32 mmol/L Final  . Glucose, Bld 11/20/2018 90  70 - 99 mg/dL Final  . BUN 11/20/2018 12  6 - 20 mg/dL Final  . Creatinine 11/20/2018 0.83  0.44 - 1.00 mg/dL Final  . Calcium 11/20/2018 9.6  8.9 - 10.3 mg/dL Final  . Total Protein 11/20/2018 6.8  6.5 - 8.1 g/dL Final  . Albumin 11/20/2018 3.9  3.5 - 5.0 g/dL Final  . AST 11/20/2018 21  15 - 41 U/L Final  . ALT 11/20/2018 17  0 - 44 U/L Final  . Alkaline Phosphatase 11/20/2018 45  38 - 126 U/L Final  . Total Bilirubin 11/20/2018 0.4  0.3 - 1.2 mg/dL Final  . GFR, Est Non Af Am 11/20/2018 >60  >60 mL/min Final  . GFR, Est AFR Am 11/20/2018 >60  >60 mL/min Final  . Anion gap 11/20/2018 7  5 - 15 Final   Performed at [facility], [place] [address] [place], [place] [address]  .  WBC Count 11/20/2018 3.5* 4.0 - 10.5 K/uL Final  . RBC 11/20/2018 3.93  3.87 - 5.11 MIL/uL Final  . Hemoglobin 11/20/2018 12.6  12.0 - 15.0 g/dL Final  . HCT 11/20/2018 37.2  36.0 - 46.0 % Final  . MCV 11/20/2018 94.7  80.0 - 100.0 fL Final  . MCH 11/20/2018 32.1  26.0 - 34.0 pg Final  . MCHC 11/20/2018 33.9  30.0 - 36.0 g/dL Final  . RDW 11/20/2018 12.9  11.5 - 15.5 % Final  . Platelet Count 11/20/2018 206  150 - 400 K/uL Final  . nRBC 11/20/2018 0.0  0.0 - 0.2 % Final  . Neutrophils Relative % 11/20/2018 54  % Final  . Neutro Abs 11/20/2018 1.9  1.7 - 7.7 K/uL Final  . Lymphocytes Relative 11/20/2018 35  % Final  . Lymphs Abs 11/20/2018 1.2  0.7 - 4.0 K/uL Final  . Monocytes Relative 11/20/2018 9  % Final  . Monocytes Absolute 11/20/2018 0.3  0.1 - 1.0 K/uL Final  . Eosinophils Relative 11/20/2018 1  % Final  . Eosinophils Absolute 11/20/2018 0.0  0.0 - 0.5 K/uL Final  . Basophils Relative 11/20/2018 1  % Final  . Basophils Absolute 11/20/2018 0.0  0.0 - 0.1 K/uL Final  . Immature Granulocytes 11/20/2018 0  % Final  . Abs Immature Granulocytes 11/20/2018 0.01  0.00 - 0.07 K/uL Final   Performed at [facility], [place] [address] [place], [place] [address]     Pathology Orders Placed This Encounter  Procedures  . CBC with Differential ([facility] Only)    Standing Status:   Future    Number of Occurrences:   1    Standing Expiration Date:   11/20/2019  . CMP ([place] only)    Standing Status:   Future    Number of Occurrences:  1    Standing Expiration Date:   11/20/2019  . Lactate dehydrogenase (LDH)    Standing Status:   Future    Number of Occurrences:   1    Standing Expiration Date:   11/20/2019       Zoila Shutter MD

## 2018-12-03 ENCOUNTER — Ambulatory Visit: Payer: Managed Care, Other (non HMO) | Admitting: Gastroenterology

## 2018-12-04 ENCOUNTER — Other Ambulatory Visit: Payer: Managed Care, Other (non HMO)

## 2018-12-16 ENCOUNTER — Encounter: Payer: Self-pay | Admitting: Family Medicine

## 2018-12-17 ENCOUNTER — Other Ambulatory Visit: Payer: Self-pay | Admitting: Family Medicine

## 2018-12-17 ENCOUNTER — Other Ambulatory Visit: Payer: Self-pay

## 2018-12-17 DIAGNOSIS — F339 Major depressive disorder, recurrent, unspecified: Secondary | ICD-10-CM

## 2018-12-17 MED ORDER — BUPROPION HCL ER (XL) 300 MG PO TB24: 300.0000 mg | ORAL_TABLET | Freq: Every day | ORAL | 0 refills | Status: DC

## 2018-12-20 ENCOUNTER — Ambulatory Visit
Admission: RE | Admit: 2018-12-20 | Discharge: 2018-12-20 | Disposition: A | Discharge status: Home / Self Care | Payer: Managed Care, Other (non HMO) | Source: Ambulatory Visit | Attending: Family Medicine | Admitting: Family Medicine

## 2018-12-20 ENCOUNTER — Telehealth: Payer: Self-pay | Admitting: Family Medicine

## 2018-12-20 ENCOUNTER — Other Ambulatory Visit: Payer: Self-pay

## 2018-12-20 DIAGNOSIS — M79671 Pain in right foot: Secondary | ICD-10-CM

## 2018-12-20 NOTE — Telephone Encounter (Signed)
Foot MRI looks normal.

## 2018-12-24 NOTE — Telephone Encounter (Signed)
I called Julie Hopkins to inform her of Dr. Alcario Drought message. Julie Hopkins scheduled to see Sam for TOC.

## 2019-01-03 ENCOUNTER — Encounter: Payer: Self-pay | Admitting: Physician Assistant

## 2019-01-03 ENCOUNTER — Ambulatory Visit (INDEPENDENT_AMBULATORY_CARE_PROVIDER_SITE_OTHER): Payer: Managed Care, Other (non HMO) | Admitting: Physician Assistant

## 2019-01-03 VITALS — Ht 67.0 in | Wt 169.5 lb

## 2019-01-03 DIAGNOSIS — E663 Overweight: Secondary | ICD-10-CM | POA: Diagnosis not present

## 2019-01-03 NOTE — Progress Notes (Signed)
Virtual Visit via Video   I connected with Julie Hopkins on 01/03/19 at  3:40 PM EDT by a video enabled telemedicine application and verified that I am speaking with the correct person using two identifiers. Location patient: Home Location provider: [facility], Office Persons participating in the virtual visit: [name], Inda Coke PA-C.,  I discussed the limitations of evaluation and management by telemedicine and the availability of in person appointments. The patient expressed understanding and agreed to proceed.  Subjective:   HPI:   Weight management Pt would like to discuss her weight. She has had weight gain since March, moved into new house, overeating. Pt would like to lose 15 lbs. She is lifetime member of [employer]. Has been on Wellbutrin for 1.5 years.   She took "diet pills" in the 80's, didn't have any issues tolerating this, only took for a short span of time.  She exercises daily. Having issues with overeating mostly. She is well versed in healthy nutrition habits, food groups, serving sizes, etc.  Wt Readings from Last 5 Encounters:  01/03/19 169 lb 8 oz (76.9 kg)  11/20/18 172 lb 14.4 oz (78.4 kg)  10/04/18 170 lb (77.1 kg)  10/02/18 170 lb (77.1 kg)  10/01/18 171 lb (77.6 kg)   ROS: See pertinent positives and negatives per HPI.  Patient Active Problem List   Diagnosis Date Noted  . Depression, recurrent ([place]) 04/29/2018  . Hair loss 04/29/2018  . Primary insomnia 04/29/2018  . Seasonal allergic rhinitis 04/29/2018  . Situational anxiety 04/29/2018    Social History   Tobacco Use  . Smoking status: Former Smoker    Types: Cigarettes    Quit date: 02/07/1998    Years since quitting: 20.9  . Smokeless tobacco: Never Used  Substance Use Topics  . Alcohol use: Yes    Alcohol/week: 1.0 - 2.0 standard drinks    Types: 1 - 2 Glasses of wine per week    Current Outpatient Medications:  .  BIOTIN PO, Take 1 tablet by  mouth daily., Disp: , Rfl:  .  buPROPion (WELLBUTRIN XL) 300 MG 24 hr tablet, Take 1 tablet (300 mg total) by mouth daily., Disp: 90 tablet, Rfl: 0 .  estradiol (ESTRACE) 1 MG tablet, Take 1 tablet (1 mg total) by mouth daily., Disp: 90 tablet, Rfl: 1 .  loratadine (CLARITIN) 10 MG tablet, Take 10 mg by mouth daily., Disp: , Rfl:  .  Multiple Vitamin (MULTIVITAMIN) tablet, Take 1 tablet by mouth daily., Disp: , Rfl:  .  pantoprazole (PROTONIX) 40 MG tablet, Take 1 tablet (40 mg total) by mouth daily. (Patient taking differently: Take 40 mg by mouth 2 (two) times daily. ), Disp: 90 tablet, Rfl: 3  Allergies  Allergen Reactions  . Sulfa Antibiotics Palpitations    Pt's mother said she was always allergic to it. Never taken med.    Body mass index is 26.55 kg/m.    Objective:   VITALS: Per patient if applicable, see vitals. GENERAL: Alert, appears well and in no acute distress. HEENT: Atraumatic, conjunctiva clear, no obvious abnormalities on inspection of external nose and ears. NECK: Normal movements of the head and neck. CARDIOPULMONARY: No increased WOB. Speaking in clear sentences. I:E ratio WNL.  MS: Moves all visible extremities without noticeable abnormality. PSYCH: Pleasant and cooperative, well-groomed. Speech normal rate and rhythm. Affect is appropriate. Insight and judgement are appropriate. Attention is focused, linear, and appropriate.  NEURO: CN grossly intact. Oriented as arrived  to appointment on time with no prompting. Moves both UE equally.  SKIN: No obvious lesions, wounds, erythema, or cyanosis noted on face or hands.  Assessment and Plan:   [name] was seen today for transfer of care and weight management.  Diagnoses and all orders for this visit:  Overweight -     VITAMIN D 25 Hydroxy (Vit-D Deficiency, Fractures); Future -     T4, free; Future -     TSH; Future -     Insulin, Free (Bioactive)-([facility]); Future   Discussed medication options.  Prefer to do a  GLP-1 over medication like phentermine, given her age.  Can update labs, and also check an free insulin.  Lab appointment scheduled for next Tuesday.  Patient is agreeable and open to options discussed.  Further intervention based on lab results.   . Reviewed expectations re: course of current medical issues. . Discussed self-management of symptoms. . Outlined signs and symptoms indicating need for more acute intervention. . Patient verbalized understanding and all questions were answered. Marland Kitchen Health Maintenance issues including appropriate healthy diet, exercise, and smoking avoidance were discussed with patient. . See orders for this visit as documented in the electronic medical record.  I discussed the assessment and treatment plan with the patient. The patient was provided an opportunity to ask questions and all were answered. The patient agreed with the plan and demonstrated an understanding of the instructions.   The patient was advised to call back or seek an in-person evaluation if the symptoms worsen or if the condition fails to improve as anticipated.   CMA or LPN served as scribe during this visit. History, Physical, and Plan performed by medical provider. The above documentation has been reviewed and is accurate and complete.  [place], [place] 01/03/2019

## 2019-01-07 ENCOUNTER — Other Ambulatory Visit: Payer: Self-pay

## 2019-01-07 ENCOUNTER — Other Ambulatory Visit (INDEPENDENT_AMBULATORY_CARE_PROVIDER_SITE_OTHER): Payer: Managed Care, Other (non HMO)

## 2019-01-07 DIAGNOSIS — E663 Overweight: Secondary | ICD-10-CM | POA: Diagnosis not present

## 2019-01-07 LAB — VITAMIN D 25 HYDROXY (VIT D DEFICIENCY, FRACTURES): VITD: 27.63 ng/mL — ABNORMAL LOW (ref 30.00–100.00)

## 2019-01-07 LAB — TSH: TSH: 1 u[IU]/mL (ref 0.35–4.50)

## 2019-01-07 LAB — T4, FREE: Free T4: 1.45 ng/dL (ref 0.60–1.60)

## 2019-01-10 ENCOUNTER — Other Ambulatory Visit: Payer: Self-pay

## 2019-01-10 ENCOUNTER — Ambulatory Visit (INDEPENDENT_AMBULATORY_CARE_PROVIDER_SITE_OTHER): Payer: Managed Care, Other (non HMO) | Admitting: Physician Assistant

## 2019-01-10 ENCOUNTER — Encounter: Payer: Self-pay | Admitting: Physician Assistant

## 2019-01-10 VITALS — Temp 97.3°F | Ht 67.0 in | Wt 170.0 lb

## 2019-01-10 DIAGNOSIS — R110 Nausea: Secondary | ICD-10-CM | POA: Diagnosis not present

## 2019-01-10 DIAGNOSIS — Z20822 Contact with and (suspected) exposure to covid-19: Secondary | ICD-10-CM

## 2019-01-10 DIAGNOSIS — H9202 Otalgia, left ear: Secondary | ICD-10-CM

## 2019-01-10 MED ORDER — ONDANSETRON HCL 4 MG PO TABS: 4.0000 mg | ORAL_TABLET | Freq: Three times a day (TID) | ORAL | 0 refills | Status: DC | PRN

## 2019-01-10 MED ORDER — AMOXICILLIN 875 MG PO TABS: 875.0000 mg | ORAL_TABLET | Freq: Two times a day (BID) | ORAL | 0 refills | Status: DC

## 2019-01-10 NOTE — Progress Notes (Signed)
Virtual Visit via Video   I connected with Julie Hopkins on 01/10/19 at  7:40 AM EDT by a video enabled telemedicine application and verified that I am speaking with the correct person using two identifiers. Location patient: Home Location provider: [facility], Office Persons participating in the virtual visit: Chaitra Nester, Addison Bailey.  I discussed the limitations of evaluation and management by telemedicine and the availability of in person appointments. The patient expressed understanding and agreed to proceed.  I acted as a Education administrator for [employer], [employer], LPN  Subjective:   HPI:   Otalgia Pt c/o stabbing Lt ear pain x 2-3 days off and on.  No drainage. Denies fever or chills. Using Advil with relief. Pt also c/o nausea x 3-4 days. No vomiting or diarrhea. No medication tried. Pt has had headaches Tues & Wed. Denies dizziness. Currently taking Claritin 10 mg daily and Protonix 40 mg once a day.   She is able to eat and drink without difficulty.  She denies any changes to her stools.  She denies any contacts that she knows of with COVID-19.  ROS: See pertinent positives and negatives per HPI.  Patient Active Problem List   Diagnosis Date Noted   Depression, recurrent ([place]) 04/29/2018   Hair loss 04/29/2018   Primary insomnia 04/29/2018   Seasonal allergic rhinitis 04/29/2018   Situational anxiety 04/29/2018    Social History   Tobacco Use   Smoking status: Former Smoker    Types: Cigarettes    Quit date: 02/07/1998    Years since quitting: 20.9   Smokeless tobacco: Never Used  Substance Use Topics   Alcohol use: Yes    Alcohol/week: 1.0 - 2.0 standard drinks    Types: 1 - 2 Glasses of wine per week    Current Outpatient Medications:    BIOTIN PO, Take 1 tablet by mouth daily., Disp: , Rfl:    buPROPion (WELLBUTRIN XL) 300 MG 24 hr tablet, Take 1 tablet (300 mg total) by mouth daily., Disp: 90 tablet, Rfl:  0   estradiol (ESTRACE) 1 MG tablet, Take 1 tablet (1 mg total) by mouth daily., Disp: 90 tablet, Rfl: 1   loratadine (CLARITIN) 10 MG tablet, Take 10 mg by mouth daily., Disp: , Rfl:    Multiple Vitamin (MULTIVITAMIN) tablet, Take 1 tablet by mouth daily., Disp: , Rfl:    pantoprazole (PROTONIX) 40 MG tablet, Take 1 tablet (40 mg total) by mouth daily. (Patient taking differently: Take 40 mg by mouth 2 (two) times daily. ), Disp: 90 tablet, Rfl: 3   amoxicillin (AMOXIL) 875 MG tablet, Take 1 tablet (875 mg total) by mouth 2 (two) times daily., Disp: 20 tablet, Rfl: 0   ondansetron (ZOFRAN) 4 MG tablet, Take 1 tablet (4 mg total) by mouth every 8 (eight) hours as needed for nausea or vomiting., Disp: 20 tablet, Rfl: 0  Allergies  Allergen Reactions   Sulfa Antibiotics Palpitations    Pt's mother said she was always allergic to it. Never taken med.     Objective:   VITALS: Per patient if applicable, see vitals. GENERAL: Alert, appears well and in no acute distress. HEENT: Atraumatic, conjunctiva clear, no obvious abnormalities on inspection of external nose and ears. NECK: Normal movements of the head and neck. CARDIOPULMONARY: No increased WOB. Speaking in clear sentences. I:E ratio WNL.  MS: Moves all visible extremities without noticeable abnormality. PSYCH: Pleasant and cooperative, well-groomed. Speech normal rate and rhythm. Affect is  appropriate. Insight and judgement are appropriate. Attention is focused, linear, and appropriate.  NEURO: CN grossly intact. Oriented as arrived to appointment on time with no prompting. Moves both UE equally.  SKIN: No obvious lesions, wounds, erythema, or cyanosis noted on face or hands.  Assessment and Plan:   Kathaline was seen today for otalgia.  Diagnoses and all orders for this visit:  Left ear pain; Nausea Patient has a respiratory illness without signs of acute distress or respiratory compromise at this time. I did discuss with her  that I cannot dx ear infection virtually, so we will go ahead and empirically treat given her worsening pain.  I have also given her zofran prn for nausea. We are also going to send patient for drive-up testing. As a precaution, they have been advised to remain home until COVID-19 results and then possible further quarantine after that based on results and symptoms. Advised if they experience a "second sickening" or worsening symptoms as the illness progresses, they are to call the office for further instructions or seek emergent evaluation for any severe symptoms.  -     Novel Coronavirus, NAA ([facility])    Other orders -     amoxicillin (AMOXIL) 875 MG tablet; Take 1 tablet (875 mg total) by mouth 2 (two) times daily. -     ondansetron (ZOFRAN) 4 MG tablet; Take 1 tablet (4 mg total) by mouth every 8 (eight) hours as needed for nausea or vomiting.     Reviewed expectations re: course of current medical issues.  Discussed self-management of symptoms.  Outlined signs and symptoms indicating need for more acute intervention.  Patient verbalized understanding and all questions were answered.  Health Maintenance issues including appropriate healthy diet, exercise, and smoking avoidance were discussed with patient.  See orders for this visit as documented in the electronic medical record.  I discussed the assessment and treatment plan with the patient. The patient was provided an opportunity to ask questions and all were answered. The patient agreed with the plan and demonstrated an understanding of the instructions.   The patient was advised to call back or seek an in-person evaluation if the symptoms worsen or if the condition fails to improve as anticipated.   CMA or LPN served as scribe during this visit. History, Physical, and Plan performed by medical provider. The above documentation has been reviewed and is accurate and complete.   [place], [place] 01/10/2019

## 2019-01-11 LAB — NOVEL CORONAVIRUS, NAA: SARS-CoV-2, NAA: NOT DETECTED

## 2019-01-15 LAB — INSULIN, FREE (BIOACTIVE): Insulin, Free: 3.9 u[IU]/mL (ref 1.5–14.9)

## 2019-01-17 ENCOUNTER — Encounter: Payer: Self-pay | Admitting: Physician Assistant

## 2019-01-20 ENCOUNTER — Other Ambulatory Visit: Payer: Self-pay | Admitting: Physician Assistant

## 2019-01-21 ENCOUNTER — Other Ambulatory Visit: Payer: Self-pay | Admitting: Physician Assistant

## 2019-01-21 DIAGNOSIS — E2839 Other primary ovarian failure: Secondary | ICD-10-CM

## 2019-01-21 DIAGNOSIS — L659 Nonscarring hair loss, unspecified: Secondary | ICD-10-CM

## 2019-01-21 MED ORDER — ESTRADIOL 1 MG PO TABS: 1.0000 mg | ORAL_TABLET | Freq: Every day | ORAL | 1 refills | Status: DC

## 2019-01-21 MED ORDER — NALTREXONE HCL 50 MG PO TABS: 25.0000 mg | ORAL_TABLET | Freq: Every day | ORAL | 0 refills | Status: AC

## 2019-01-23 ENCOUNTER — Encounter: Payer: Self-pay | Admitting: Obstetrics and Gynecology

## 2019-02-01 ENCOUNTER — Ambulatory Visit
Admission: EM | Admit: 2019-02-01 | Discharge: 2019-02-01 | Disposition: A | Discharge status: Home / Self Care | Payer: Managed Care, Other (non HMO) | Attending: Emergency Medicine | Admitting: Emergency Medicine

## 2019-02-01 ENCOUNTER — Ambulatory Visit
Admission: RE | Admit: 2019-02-01 | Discharge: 2019-02-01 | Disposition: A | Discharge status: Home / Self Care | Payer: Managed Care, Other (non HMO) | Source: Ambulatory Visit

## 2019-02-01 ENCOUNTER — Other Ambulatory Visit: Payer: Self-pay

## 2019-02-01 DIAGNOSIS — L249 Irritant contact dermatitis, unspecified cause: Secondary | ICD-10-CM

## 2019-02-01 MED ORDER — TRIAMCINOLONE ACETONIDE 0.1 % EX CREA: 1.0000 "application " | TOPICAL_CREAM | Freq: Two times a day (BID) | CUTANEOUS | 0 refills | Status: DC

## 2019-02-01 NOTE — Discharge Instructions (Addendum)
Apply triamcinolone as prescribed. May take benadryl as needed for itching. Return for worsening itching, pain, redness, spreading of rash, joint/muscle pain, fever.

## 2019-02-01 NOTE — ED Triage Notes (Signed)
Pt presents to UC w/ c/o rash on right lower abdomen x4 days. Pt reports having nausea and vomiting on x6 days ago, feeling sluggish and headache since then. Pt states rash itches at times.

## 2019-02-01 NOTE — ED Provider Notes (Signed)
[facility]    CSN: [id number] Arrival date & time: 02/01/19  0954      History   Chief Complaint Chief Complaint  Patient presents with  . Rash    HPI Julie Hopkins is a 58 y.o. female presenting for 4-day course of right lower quadrant pruritic rash.  Patient states that she did have single episode of nausea with emesis (without bile or blood) a few days prior.  Patient states this is accompanied by mild headache, mild fatigue which has been improving since.  Patient states her primary concern is that she may have shingles and is concerned that she needs to be extra cautious around her husband, who is immunocompromised.  Patient denies fever, myalgias, joint pain, pain preceding rash eruption.  Patient denies known bug bite, contact exposure with known allergens.   Past Medical History:  Diagnosis Date  . Depression   . GERD (gastroesophageal reflux disease)   . Heart murmur     Patient Active Problem List   Diagnosis Date Noted  . Depression, recurrent ([place]) 04/29/2018  . Hair loss 04/29/2018  . Primary insomnia 04/29/2018  . Seasonal allergic rhinitis 04/29/2018  . Situational anxiety 04/29/2018    Past Surgical History:  Procedure Laterality Date  . ABDOMINAL HYSTERECTOMY  2005  . MYOMECTOMY    . SALPINGOOPHORECTOMY     both at 2 different surgeries  . WISDOM TOOTH EXTRACTION      OB History   No obstetric history on file.      Home Medications    Prior to Admission medications   Medication Sig Start Date End Date Taking? Authorizing Provider  BIOTIN PO Take 1 tablet by mouth daily.    [provider]  buPROPion (WELLBUTRIN XL) 300 MG 24 hr tablet Take 1 tablet (300 mg total) by mouth daily. 12/17/18   Briscoe Deutscher, DO  estradiol (ESTRACE) 1 MG tablet Take 1 tablet (1 mg total) by mouth daily. 01/21/19   Inda Coke, PA  loratadine (CLARITIN) 10 MG tablet Take 10 mg by mouth daily.    [provider]   Multiple Vitamin (MULTIVITAMIN) tablet Take 1 tablet by mouth daily.    [provider]  naltrexone (DEPADE) 50 MG tablet Take 0.5 tablets (25 mg total) by mouth daily. 01/21/19 02/20/19  Inda Coke, PA  pantoprazole (PROTONIX) 40 MG tablet Take 1 tablet (40 mg total) by mouth daily. Patient taking differently: Take 40 mg by mouth 2 (two) times daily.  10/04/18   Thornton Park, MD  triamcinolone cream (KENALOG) 0.1 % Apply 1 application topically 2 (two) times daily. 02/01/19   Hall-Potvin, [place], PA-C    Family History Family History  Problem Relation Age of Onset  . Cancer Mother   . COPD Mother   . Early death Mother   . Heart disease Mother   . Miscarriages / [place] Mother   . Alcohol abuse Father   . Depression Father   . Alcohol abuse Brother   . Early death Brother   . Heart attack Brother   . Hypertension Brother   . Early death Paternal Grandfather   . Depression Daughter   . Alcohol abuse Brother   . Depression Brother   . Heart disease Brother   . Colon cancer Neg Hx   . Pancreatic cancer Neg Hx   . Rectal cancer Neg Hx   . Stomach cancer Neg Hx     Social History Social History   Tobacco Use  .  Smoking status: Former Smoker    Types: Cigarettes    Quit date: 02/07/1998    Years since quitting: 20.9  . Smokeless tobacco: Never Used  Substance Use Topics  . Alcohol use: Yes    Alcohol/week: 1.0 - 2.0 standard drinks    Types: 1 - 2 Glasses of wine per week    Comment: occasionally  . Drug use: Never     Allergies   Sulfa antibiotics   Review of Systems Review of Systems  Constitutional: Negative for fatigue and fever.  HENT: Negative for ear pain, sinus pain, sore throat and voice change.   Eyes: Negative for pain, redness and visual disturbance.  Respiratory: Negative for cough and shortness of breath.   Cardiovascular: Negative for chest pain and palpitations.  Gastrointestinal: Negative for abdominal pain, diarrhea  and vomiting.  Musculoskeletal: Negative for arthralgias and myalgias.  Skin: Positive for rash. Negative for wound.  Neurological: Negative for syncope and headaches.     Physical Exam Triage Vital Signs ED Triage Vitals  Enc Vitals Group     BP 02/01/19 1003 113/70     Pulse Rate 02/01/19 1003 (!) 52     Resp 02/01/19 1003 16     Temp 02/01/19 1003 98 F (36.7 C)     Temp Source 02/01/19 1003 Oral     SpO2 02/01/19 1003 99 %     Weight --      Height --      Head Circumference --      Peak Flow --      Pain Score 02/01/19 1013 0     Pain Loc --      Pain Edu? --      Excl. in [place]? --    No data found.  Updated Vital Signs BP 113/70 (BP Location: Left Arm)   Pulse (!) 52   Temp 98 F (36.7 C) (Oral)   Resp 16   SpO2 99%   Visual Acuity Right Eye Distance:   Left Eye Distance:   Bilateral Distance:    Right Eye Near:   Left Eye Near:    Bilateral Near:     Physical Exam Constitutional:      General: She is not in acute distress.    Appearance: She is normal weight. She is not ill-appearing.  HENT:     Head: Normocephalic and atraumatic.     Mouth/Throat:     Mouth: Mucous membranes are moist.     Pharynx: Oropharynx is clear.  Eyes:     General: No scleral icterus.    Pupils: Pupils are equal, round, and reactive to light.  Neck:     Musculoskeletal: Neck supple. No muscular tenderness.  Cardiovascular:     Rate and Rhythm: Regular rhythm. Bradycardia present.     Heart sounds: No murmur. No gallop.      Comments: Heart rate varies from 52- 58 bpm.  Patient states this is chronic/stable.  No symptoms at this time Pulmonary:     Effort: Pulmonary effort is normal.  Lymphadenopathy:     Cervical: No cervical adenopathy.  Skin:    Capillary Refill: Capillary refill takes less than 2 seconds.     Coloration: Skin is not jaundiced or pale.     Comments: 2.5-3 cm area of erythema, dry skin that is slightly raised with pallor surrounding and right lower  quadrant (above groin, where the belt buckle would lay).  No central clearing, warmth, tenderness to palpation.  No  pustules or vesicles.  No open wound, discharge.  Does not cross midline.  No other lesions.  Neurological:     Mental Status: She is alert and oriented to person, place, and time.      UC Treatments / Results  Labs (all labs ordered are listed, but only abnormal results are displayed) Labs Reviewed - No data to display  EKG   Radiology No results found.   Procedures Procedures (including critical care time)  Medications Ordered in UC Medications - No data to display  Initial Impression / Assessment and Plan / UC Course  I have reviewed the triage vital signs and the nursing notes.  Pertinent labs & imaging results that were available during my care of the patient were reviewed by me and considered in my medical decision making (see chart for details).     1.  Irritant dermatitis Irritant dermatitis versus very mild shingles eruption.  After conversation regarding clinical course and treatment of shingles, this provider and patient both feel that contact irritant dermatitis is more likely due to lack of preceding signs/symptoms, lack of pain currently.  Will trial triamcinolone, Benadryl at night for pruritus (patient declined Vistaril).  Return precautions discussed, patient verbalized understanding and is agreeable to plan. Final Clinical Impressions(s) / UC Diagnoses   Final diagnoses:  Irritant dermatitis     Discharge Instructions     Apply triamcinolone as prescribed. May take benadryl as needed for itching. Return for worsening itching, pain, redness, spreading of rash, joint/muscle pain, fever.    ED Prescriptions    Medication Sig Dispense Auth. Provider   triamcinolone cream (KENALOG) 0.1 % Apply 1 application topically 2 (two) times daily. 30 g Hall-Potvin, [place], PA-C     PDMP not reviewed this encounter.   Hall-Potvin, [place], [place]  02/01/19 1113

## 2019-02-20 ENCOUNTER — Encounter: Payer: Self-pay | Admitting: Physician Assistant

## 2019-03-03 ENCOUNTER — Other Ambulatory Visit: Payer: Self-pay

## 2019-03-04 ENCOUNTER — Other Ambulatory Visit (HOSPITAL_COMMUNITY)
Admission: RE | Admit: 2019-03-04 | Discharge: 2019-03-04 | Disposition: A | Discharge status: Home / Self Care | Payer: Managed Care, Other (non HMO) | Source: Ambulatory Visit | Attending: Obstetrics and Gynecology | Admitting: Obstetrics and Gynecology

## 2019-03-04 ENCOUNTER — Ambulatory Visit: Payer: Managed Care, Other (non HMO) | Admitting: Obstetrics and Gynecology

## 2019-03-04 ENCOUNTER — Encounter: Payer: Self-pay | Admitting: Obstetrics and Gynecology

## 2019-03-04 ENCOUNTER — Other Ambulatory Visit: Payer: Self-pay | Admitting: Obstetrics and Gynecology

## 2019-03-04 VITALS — BP 122/76 | HR 60 | Temp 97.1°F | Ht 67.0 in | Wt 176.2 lb

## 2019-03-04 DIAGNOSIS — Z7189 Other specified counseling: Secondary | ICD-10-CM

## 2019-03-04 DIAGNOSIS — R350 Frequency of micturition: Secondary | ICD-10-CM

## 2019-03-04 DIAGNOSIS — Z01419 Encounter for gynecological examination (general) (routine) without abnormal findings: Secondary | ICD-10-CM

## 2019-03-04 DIAGNOSIS — N3946 Mixed incontinence: Secondary | ICD-10-CM

## 2019-03-04 DIAGNOSIS — Z1272 Encounter for screening for malignant neoplasm of vagina: Secondary | ICD-10-CM | POA: Diagnosis not present

## 2019-03-04 DIAGNOSIS — N9089 Other specified noninflammatory disorders of vulva and perineum: Secondary | ICD-10-CM

## 2019-03-04 LAB — POCT URINALYSIS DIPSTICK
Bilirubin, UA: NEGATIVE
Blood, UA: NEGATIVE
Glucose, UA: NEGATIVE
Ketones, UA: NEGATIVE
Leukocytes, UA: NEGATIVE
Nitrite, UA: NEGATIVE
Protein, UA: NEGATIVE
Spec Grav, UA: 1.01 (ref 1.010–1.025)
Urobilinogen, UA: 0.2 E.U./dL
pH, UA: 5 (ref 5.0–8.0)

## 2019-03-04 MED ORDER — ESTRADIOL 0.0375 MG/24HR TD PTTW: 1.0000 | MEDICATED_PATCH | TRANSDERMAL | 12 refills | Status: DC

## 2019-03-04 NOTE — Patient Instructions (Signed)
EXERCISE AND DIET:  We recommended that you start or continue a regular exercise program for good health. Regular exercise means any activity that makes your heart beat faster and makes you sweat.  We recommend exercising at least 30 minutes per day at least 3 days a week, preferably 4 or 5.  We also recommend a diet low in fat and sugar.  Inactivity, poor dietary choices and obesity can cause diabetes, heart attack, stroke, and kidney damage, among others.   ° °ALCOHOL AND SMOKING:  Women should limit their alcohol intake to no more than 7 drinks/beers/glasses of wine (combined, not each!) per week. Moderation of alcohol intake to this level decreases your risk of breast cancer and liver damage. And of course, no recreational drugs are part of a healthy lifestyle.  And absolutely no smoking or even second hand smoke. Most people know smoking can cause heart and lung diseases, but did you know it also contributes to weakening of your bones? Aging of your skin?  Yellowing of your teeth and nails? ° °CALCIUM AND VITAMIN D:  Adequate intake of calcium and Vitamin D are recommended.  The recommendations for exact amounts of these supplements seem to change often, but generally speaking 1,200 mg of calcium (between diet and supplement) and 800 units of Vitamin D per day seems prudent. Certain women may benefit from higher intake of Vitamin D.  If you are among these women, your doctor will have told you during your visit.   ° °PAP SMEARS:  Pap smears, to check for cervical cancer or precancers,  have traditionally been done yearly, although recent scientific advances have shown that most women can have pap smears less often.  However, every woman still should have a physical exam from her gynecologist every year. It will include a breast check, inspection of the vulva and vagina to check for abnormal growths or skin changes, a visual exam of the cervix, and then an exam to evaluate the size and shape of the uterus and  ovaries.  And after 60 years of age, a rectal exam is indicated to check for rectal cancers. We will also provide age appropriate advice regarding health maintenance, like when you should have certain vaccines, screening for sexually transmitted diseases, bone density testing, colonoscopy, mammograms, etc.  ° °MAMMOGRAMS:  All women over 40 years old should have a yearly mammogram. Many facilities now offer a "3D" mammogram, which may cost around $50 extra out of pocket. If possible,  we recommend you accept the option to have the 3D mammogram performed.  It both reduces the number of women who will be called back for extra views which then turn out to be normal, and it is better than the routine mammogram at detecting truly abnormal areas.   ° °COLON CANCER SCREENING: Now recommend starting at age 45. At this time colonoscopy is not covered for routine screening until 50. There are take home tests that can be done between 45-49.  ° °COLONOSCOPY:  Colonoscopy to screen for colon cancer is recommended for all women at age 50.  We know, you hate the idea of the prep.  We agree, BUT, having colon cancer and not knowing it is worse!!  Colon cancer so often starts as a polyp that can be seen and removed at colonscopy, which can quite literally save your life!  And if your first colonoscopy is normal and you have no family history of colon cancer, most women don't have to have it again for   10 years.  Once every ten years, you can do something that may end up saving your life, right?  We will be happy to help you get it scheduled when you are ready.  Be sure to check your insurance coverage so you understand how much it will cost.  It may be covered as a preventative service at no cost, but you should check your particular policy.   ° ° ° °Breast Self-Awareness °Breast self-awareness means being familiar with how your breasts look and feel. It involves checking your breasts regularly and reporting any changes to your  health care provider. °Practicing breast self-awareness is important. A change in your breasts can be a sign of a serious medical problem. Being familiar with how your breasts look and feel allows you to find any problems early, when treatment is more likely to be successful. All women should practice breast self-awareness, including women who have had breast implants. °How to do a breast self-exam °One way to learn what is normal for your breasts and whether your breasts are changing is to do a breast self-exam. To do a breast self-exam: °Look for Changes ° °1. Remove all the clothing above your waist. °2. Stand in front of a mirror in a room with good lighting. °3. Put your hands on your hips. °4. Push your hands firmly downward. °5. Compare your breasts in the mirror. Look for differences between them (asymmetry), such as: °? Differences in shape. °? Differences in size. °? Puckers, dips, and bumps in one breast and not the other. °6. Look at each breast for changes in your skin, such as: °? Redness. °? Scaly areas. °7. Look for changes in your nipples, such as: °? Discharge. °? Bleeding. °? Dimpling. °? Redness. °? A change in position. °Feel for Changes °Carefully feel your breasts for lumps and changes. It is best to do this while lying on your back on the floor and again while sitting or standing in the shower or tub with soapy water on your skin. Feel each breast in the following way: °· Place the arm on the side of the breast you are examining above your head. °· Feel your breast with the other hand. °· Start in the nipple area and make ¾ inch (2 cm) overlapping circles to feel your breast. Use the pads of your three middle fingers to do this. Apply light pressure, then medium pressure, then firm pressure. The light pressure will allow you to feel the tissue closest to the skin. The medium pressure will allow you to feel the tissue that is a little deeper. The firm pressure will allow you to feel the tissue  close to the ribs. °· Continue the overlapping circles, moving downward over the breast until you feel your ribs below your breast. °· Move one finger-width toward the center of the body. Continue to use the ¾ inch (2 cm) overlapping circles to feel your breast as you move slowly up toward your collarbone. °· Continue the up and down exam using all three pressures until you reach your armpit. ° °Write Down What You Find ° °Write down what is normal for each breast and any changes that you find. Keep a written record with breast changes or normal findings for each breast. By writing this information down, you do not need to depend only on memory for size, tenderness, or location. Write down where you are in your menstrual cycle, if you are still menstruating. °If you are having trouble noticing differences   in your breasts, do not get discouraged. With time you will become more familiar with the variations in your breasts and more comfortable with the exam. How often should I examine my breasts? Examine your breasts every month. If you are breastfeeding, the best time to examine your breasts is after a feeding or after using a breast pump. If you menstruate, the best time to examine your breasts is 5-7 days after your period is over. During your period, your breasts are lumpier, and it may be more difficult to notice changes. When should I see my health care provider? See your health care provider if you notice:  A change in shape or size of your breasts or nipples.  A change in the skin of your breast or nipples, such as a reddened or scaly area.  Unusual discharge from your nipples.  A lump or thick area that was not there before.  Pain in your breasts.  Anything that concerns you.   Genital Herpes Genital herpes is a common sexually transmitted infection (STI) that is caused by a virus. The virus spreads from person to person through sexual contact. Infection can cause itching, blisters, and  sores around the genitals or rectum. Symptoms may last several days and then go away This is called an outbreak. However, the virus remains in your body, so you may have more outbreaks in the future. The time between outbreaks varies and can be months or years. Genital herpes affects men and women. It is particularly concerning for pregnant women because the virus can be passed to the baby during delivery and can cause serious problems. Genital herpes is also a concern for people who have a weak disease-fighting (immune) system. What are the causes? This condition is caused by the herpes simplex virus (HSV) type 1 or type 2. The virus may spread through:  Sexual contact with an infected person, including vaginal, anal, and oral sex.  Contact with fluid from a herpes sore.  The skin. This means that you can get herpes from an infected partner even if he or she does not have a visible sore or does not know that he or she is infected. What increases the risk? You are more likely to develop this condition if:  You have sex with many partners.  You do not use latex condoms during sex. What are the signs or symptoms? Most people do not have symptoms (asymptomatic) or have mild symptoms that may be mistaken for other skin problems. Symptoms may include:  Small red bumps near the genitals, rectum, or mouth. These bumps turn into blisters and then turn into sores.  Flu-like symptoms, including: ? Fever. ? Body aches. ? Swollen lymph nodes. ? Headache.  Painful urination.  Pain and itching in the genital area or rectal area.  Vaginal discharge.  Tingling or shooting pain in the legs and buttocks. Generally, symptoms are more severe and last longer during the first (primary) outbreak. Flu-like symptoms are also more common during the primary outbreak. How is this diagnosed? Genital herpes may be diagnosed based on:  A physical exam.  Your medical history.  Blood tests.  A test of a  fluid sample (culture) from an open sore. How is this treated? There is no cure for this condition, but treatment with antiviral medicines that are taken by mouth (orally) can do the following:  Speed up healing and relieve symptoms.  Help to reduce the spread of the virus to sexual partners.  Limit the chance of  future outbreaks, or make future outbreaks shorter.  Lessen symptoms of future outbreaks. Your health care provider may also recommend pain relief medicines, such as aspirin or ibuprofen. Follow these instructions at home: Sexual activity  Do not have sexual contact during active outbreaks.  Practice safe sex. Latex condoms and female condoms may help prevent the spread of the herpes virus. General instructions  Keep the affected areas dry and clean.  Take over-the-counter and prescription medicines only as told by your health care provider.  Avoid rubbing or touching blisters and sores. If you do touch blisters or sores: ? Wash your hands thoroughly with soap and water. ? Do not touch your eyes afterward.  To help relieve pain or itching, you may take the following actions as directed by your health care provider: ? Apply a cold, wet cloth (cold compress) to affected areas 4-6 times a day. ? Apply a substance that protects your skin and reduces bleeding (astringent). ? Apply a gel that helps relieve pain around sores (lidocaine gel). ? Take a warm, shallow bath that cleans the genital area (sitz bath).  Keep all follow-up visits as told by your health care provider. This is important. How is this prevented?  Use condoms. Although anyone can get genital herpes during sexual contact, even with the use of a condom, a condom can provide some protection.  Avoid having multiple sexual partners.  Talk with your sexual partner about any symptoms either of you may have. Also, talk with your partner about any history of STIs.  Get tested for STIs before you have sex. Ask  your partner to do the same.  Do not have sexual contact if you have symptoms of genital herpes. Contact a health care provider if:  Your symptoms are not improving with medicine.  Your symptoms return.  You have new symptoms.  You have a fever.  You have abdominal pain.  You have redness, swelling, or pain in your eye.  You notice new sores on other parts of your body.  You are a woman and experience bleeding between menstrual periods.  You have had herpes and you become pregnant or plan to become pregnant. Summary  Genital herpes is a common sexually transmitted infection (STI) that is caused by the herpes simplex virus (HSV) type 1 or type 2.  These viruses are most often spread through sexual contact with an infected person.  You are more likely to develop this condition if you have sex with many partners or you have unprotected sex.  Most people do not have symptoms (asymptomatic) or have mild symptoms that may be mistaken for other skin problems. Symptoms occur as outbreaks that may happen months or years apart.  There is no cure for this condition, but treatment with oral antiviral medicines can reduce symptoms, reduce the chance of spreading the virus to a partner, prevent future outbreaks, or shorten future outbreaks. This information is not intended to replace advice given to you by your health care provider. Make sure you discuss any questions you have with your health care provider. Document Released: 04/07/2000 Document Revised: 10/15/2017 Document Reviewed: 03/10/2016 Elsevier Patient Education  2020 [employer].

## 2019-03-04 NOTE — Progress Notes (Signed)
ref

## 2019-03-04 NOTE — Progress Notes (Signed)
58 y.o. G59P1011 Married White or Caucasian Not Hispanic or Latino female here for annual exam.  H/O hysterectomy, she is on oral ERT. No vasomotor symptoms. Wants to stay on it.  Sexually active, no pain.     She has urinary frequency for years, voids small to large amounts. Can void 3-4 x in an am, varies from day to day. She drinks about 12-18 oz of coffee q am. Rare ETOH. She c/o a 5 year h/o urinary incontinence. She has mixed incontinence. She is leaking a couple x a week, couple of drops. Worse if she is overfull.   She has gained 20 lbs with covid. She is a runner and has had injuries.   No LMP recorded. Patient has had a hysterectomy.          Sexually active: Yes.    The current method of family planning is post menopausal status.    Exercising: Yes.    running, walking, weights Smoker:  no  Health Maintenance: Pap:  2018 WNL per patient History of abnormal Pap:  Yes, cryosurgery in 30s MMG:  10/07/2018 Birads 1 negative BMD:   2016 WNL Colonoscopy: 11/08/2018 WNL TDaP:  10/01/2018 Gardasil: N/A   reports that she quit smoking about 21 years ago. Her smoking use included cigarettes. She has never used smokeless tobacco. She reports current alcohol use of about 1.0 - 2.0 standard drinks of alcohol per week. She reports that she does not use drugs. Retired. Moved her from [place] a year ago. Used to live her, retired here. Daughter goes to [employer], Paramedic, Furniture conservator/restorer and Art, pre-med.    Past Medical History:  Diagnosis Date  . Anemia   . Depression   . GERD (gastroesophageal reflux disease)   . Heart murmur     Past Surgical History:  Procedure Laterality Date  . ABDOMINAL HYSTERECTOMY  2005  . GYNECOLOGIC CRYOSURGERY    . MYOMECTOMY    . SALPINGOOPHORECTOMY     both at 2 different surgeries  . WISDOM TOOTH EXTRACTION      Current Outpatient Medications  Medication Sig Dispense Refill  . BIOTIN PO Take 1 tablet by mouth daily.    Marland Kitchen buPROPion (WELLBUTRIN XL) 300 MG  24 hr tablet Take 1 tablet (300 mg total) by mouth daily. 90 tablet 0  . estradiol (ESTRACE) 1 MG tablet Take 1 tablet (1 mg total) by mouth daily. 90 tablet 1  . loratadine (CLARITIN) 10 MG tablet Take 10 mg by mouth daily.    . Multiple Vitamin (MULTIVITAMIN) tablet Take 1 tablet by mouth daily.    . pantoprazole (PROTONIX) 40 MG tablet Take 1 tablet (40 mg total) by mouth daily. (Patient taking differently: Take 40 mg by mouth 2 (two) times daily. ) 90 tablet 3   No current facility-administered medications for this visit.     Family History  Problem Relation Age of Onset  . Cancer Mother   . COPD Mother   . Early death Mother   . Heart disease Mother   . Miscarriages / [place] Mother   . Alcohol abuse Father   . Depression Father   . Alcohol abuse Brother   . Early death Brother   . Heart attack Brother   . Hypertension Brother   . Uterine cancer Paternal Grandmother   . Early death Paternal Grandfather   . Depression Daughter   . Alcohol abuse Brother   . Depression Brother   . Heart disease Brother   . Colon  cancer Neg Hx   . Pancreatic cancer Neg Hx   . Rectal cancer Neg Hx   . Stomach cancer Neg Hx     Review of Systems  Constitutional: Negative.   HENT: Negative.   Eyes: Negative.   Respiratory: Negative.   Cardiovascular: Negative.   Gastrointestinal: Negative.   Endocrine: Negative.   Genitourinary: Positive for frequency.       Urinary incontinence  Musculoskeletal: Negative.   Skin: Negative.   Allergic/Immunologic: Negative.   Neurological: Negative.   Hematological: Negative.   Psychiatric/Behavioral: Negative.     Exam:   BP 122/76 (BP Location: Right Arm, Patient Position: Sitting, Cuff Size: Normal)   Pulse 60   Temp (!) 97.1 F (36.2 C) (Skin)   Ht 5\' 7"  (1.702 m)   Wt 176 lb 3.2 oz (79.9 kg)   BMI 27.60 kg/m   Weight change: @WEIGHTCHANGE @ Height:   Height: 5\' 7"  (170.2 cm)  Ht Readings from Last 3 Encounters:  03/04/19 5\' 7"   (1.702 m)  01/10/19 5\' 7"  (1.702 m)  01/03/19 5\' 7"  (1.702 m)    General appearance: alert, cooperative and appears stated age Head: Normocephalic, without obvious abnormality, atraumatic Neck: no adenopathy, supple, symmetrical, trachea midline and thyroid normal to inspection and palpation Lungs: clear to auscultation bilaterally Cardiovascular: regular rate and rhythm Breasts: normal appearance, no masses or tenderness Abdomen: soft, non-tender; non distended,  no masses,  no organomegaly Extremities: extremities normal, atraumatic, no cyanosis or edema Skin: Skin color, texture, turgor normal. No rashes or lesions Lymph nodes: Cervical, supraclavicular, and axillary nodes normal. No abnormal inguinal nodes palpated Neurologic: Grossly normal   Pelvic: External genitalia:  Focal irritation, erythema and ? Blisters on the lower right vulva.              Urethra:  normal appearing urethra with no masses, tenderness or lesions              Bartholins and Skenes: normal                 Vagina: normal appearing vagina with normal color and discharge, no lesions              Cervix: absent               Bimanual Exam:  Uterus:  uterus absent              Adnexa: no mass, fullness, tenderness               Rectovaginal: Confirms               Anus:  normal sphincter tone, no lesions  Chaperone was present for exam.  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 Not very bothersome.  P:   Pap from apex  Mammogram UTD  Colonoscopy UTD  DEXA UTD  Discussed breast self exam  Discussed calcium and vit D intake  Change to transdermal estrogen and decrease dose (she will call if she doesn't like it).  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

## 2019-03-06 ENCOUNTER — Telehealth: Payer: Self-pay

## 2019-03-06 LAB — CYTOLOGY - PAP
Comment: NEGATIVE
Diagnosis: NEGATIVE
High risk HPV: NEGATIVE

## 2019-03-06 LAB — HSV NAA
HSV 1 NAA: NEGATIVE
HSV 2 NAA: POSITIVE — AB

## 2019-03-06 NOTE — Telephone Encounter (Signed)
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 Call in valtrex 500 mg, 1 tab po BID x 3 days with an outbreak. Pap and hpv were negative, 02 recall.

## 2019-03-06 NOTE — Telephone Encounter (Signed)
Left message to call Kaitlyn at [phone]. 

## 2019-03-07 ENCOUNTER — Encounter: Payer: Managed Care, Other (non HMO) | Admitting: Obstetrics and Gynecology

## 2019-03-10 ENCOUNTER — Other Ambulatory Visit: Payer: Self-pay | Admitting: Physician Assistant

## 2019-03-10 ENCOUNTER — Encounter: Payer: Self-pay | Admitting: Physician Assistant

## 2019-03-10 DIAGNOSIS — F339 Major depressive disorder, recurrent, unspecified: Secondary | ICD-10-CM

## 2019-03-10 MED ORDER — BUPROPION HCL ER (XL) 300 MG PO TB24: 300.0000 mg | ORAL_TABLET | Freq: Every day | ORAL | 2 refills | Status: DC

## 2019-03-10 NOTE — Telephone Encounter (Signed)
Okay to refill Wellbutrin 300 mg daily? Last OV 01/10/19.

## 2019-03-11 ENCOUNTER — Encounter: Payer: Self-pay | Admitting: Gastroenterology

## 2019-03-12 ENCOUNTER — Encounter: Payer: Self-pay | Admitting: Gastroenterology

## 2019-03-12 MED ORDER — VALACYCLOVIR HCL 500 MG PO TABS: ORAL_TABLET | ORAL | 1 refills | Status: DC

## 2019-03-12 NOTE — Telephone Encounter (Signed)
Spoke with patient. Results given. Patient verbalizes understanding. Valtrex 500 mg po BID x 3 days #30 1RF sent to pharmacy on file. 02 recall entered. Encounter closed.

## 2019-03-18 ENCOUNTER — Telehealth: Payer: Self-pay

## 2019-03-18 ENCOUNTER — Telehealth: Payer: Self-pay | Admitting: Obstetrics and Gynecology

## 2019-03-18 ENCOUNTER — Encounter: Payer: Self-pay | Admitting: Obstetrics and Gynecology

## 2019-03-18 ENCOUNTER — Encounter: Payer: Self-pay | Admitting: Physical Therapy

## 2019-03-18 ENCOUNTER — Other Ambulatory Visit: Payer: Self-pay

## 2019-03-18 ENCOUNTER — Ambulatory Visit: Payer: Managed Care, Other (non HMO) | Attending: Obstetrics and Gynecology | Admitting: Physical Therapy

## 2019-03-18 DIAGNOSIS — M6281 Muscle weakness (generalized): Secondary | ICD-10-CM | POA: Insufficient documentation

## 2019-03-18 DIAGNOSIS — N3946 Mixed incontinence: Secondary | ICD-10-CM | POA: Insufficient documentation

## 2019-03-18 NOTE — Telephone Encounter (Signed)
Message left to return call to Triage Nurse at [phone].    

## 2019-03-18 NOTE — Therapy (Signed)
[facility] [address] [place], [place], [address] Phone: [phone]   Fax:  [phone]  Physical Therapy Evaluation  Patient Details  Name: Deloria Broaden MRN: [id number] Date of Birth: [birth year] Referring Provider (PT): Dr. Salvadore Dom   Encounter Date: 03/18/2019  PT End of Session - 03/18/19 1210    Visit Number  1    PT Start Time  [id number]    PT Stop Time  1200    PT Time Calculation (min)  15 min    Activity Tolerance  Patient tolerated treatment well    Behavior During Therapy  [facility] for tasks assessed/performed       Past Medical History:  Diagnosis Date  . Anemia   . Depression   . GERD (gastroesophageal reflux disease)   . Heart murmur     Past Surgical History:  Procedure Laterality Date  . ABDOMINAL HYSTERECTOMY  2005  . GYNECOLOGIC CRYOSURGERY    . MYOMECTOMY    . SALPINGOOPHORECTOMY     both at 2 different surgeries  . WISDOM TOOTH EXTRACTION      There were no vitals filed for this visit.   Subjective Assessment - 03/18/19 1148    Subjective  Patient had leakage trouble for several years. She has done kegels and was not helping.Patietn is a runner and will leak sometimes when she has to go. Patient wears no pads. When she has to go she has to get to the bathroom. I want the treatment that you sit on a vibration chair. I did it 2 year s ago and I had no urinary incontinence afterwards.    Currently in Pain?  No/denies         [facility] PT Assessment - 03/18/19 0001      Assessment   Medical Diagnosis  N39.46 Mixed Incontinence    Referring Provider (PT)  Dr. Salvadore Dom                Objective measurements completed on examination: See above findings.    Patient wanted to do a therapy that she sits on a vibration chair to work with urinary incontinence. Patient has had this in the past and had good results. Patient prefers not to come to this  facility due to [place] not having this particular treatment.                          Patient will benefit from skilled therapeutic intervention in order to improve the following deficits and impairments:     Visit Diagnosis: Muscle weakness (generalized)  Mixed incontinence     Problem List Patient Active Problem List   Diagnosis Date Noted  . Depression, recurrent ([place]) 04/29/2018  . Hair loss 04/29/2018  . Primary insomnia 04/29/2018  . Seasonal allergic rhinitis 04/29/2018  . Situational anxiety 04/29/2018    Earlie Counts, PT 03/18/19 12:11 PM   [facility] [address] [place], [place], [address] Phone: [phone]   Fax:  [phone]  Name: Lorilynn Cata MRN: [id number] Date of Birth: December 05, 1960

## 2019-03-18 NOTE — Telephone Encounter (Signed)
Patient sent the following message through [place]. Routing to triage to assist patient with request.  Julie Ar "[name]"  [name]  Phone Number: [phone]        Hi Dr Talbert Nan,   I took the viral meds 2x a day for 3 days and I'm not convinced the outbreak has gone away. While I was taking the meds my symptoms went away. The sores are gone. However, once I stopped taking the meds I am still having pain in my genital area. Actually, I thought the pain was associated with a pain I'm experiencing from running but now I realize it's different. I still have an issue with my piriformis that's not related to this. Should I continue to take the meds? Thank you.

## 2019-03-19 NOTE — Telephone Encounter (Signed)
Duplicate phone encounter. Will close.

## 2019-03-26 NOTE — Telephone Encounter (Signed)
Left message for pt to call back to triage, RN.

## 2019-03-27 NOTE — Telephone Encounter (Signed)
Sent mychart message back to pt to call office and speak with triage RN.

## 2019-03-28 ENCOUNTER — Ambulatory Visit (AMBULATORY_SURGERY_CENTER): Payer: Managed Care, Other (non HMO) | Admitting: *Deleted

## 2019-03-28 ENCOUNTER — Encounter: Payer: Self-pay | Admitting: Gastroenterology

## 2019-03-28 ENCOUNTER — Other Ambulatory Visit: Payer: Self-pay

## 2019-03-28 VITALS — Temp 97.2°F | Ht 67.0 in | Wt 176.0 lb

## 2019-03-28 DIAGNOSIS — Z1159 Encounter for screening for other viral diseases: Secondary | ICD-10-CM

## 2019-03-28 DIAGNOSIS — K2270 Barrett's esophagus without dysplasia: Secondary | ICD-10-CM

## 2019-03-28 NOTE — Telephone Encounter (Signed)
Patient is returning call to speak with a triage nurse.

## 2019-03-28 NOTE — Telephone Encounter (Signed)
I would advise she continue of suppression dose of 500 mg daily until seen.

## 2019-03-28 NOTE — Telephone Encounter (Signed)
Spoke with patient. 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 Patient reports "rash" has resolved, clear vaginal d/c and pain in right groin "soreness"  still present. Denies any lesions, fever/chills. Symptoms did improve with Valtrex. Patient asking is she needs to take additional doses of Valtrex?   Advised patient OV recommended if symptoms not resolved or new symptoms have developed. Patient declines OV today. Patient is requesting morning OV on Monday or Tues. Advised patient Dr. Talbert Nan will be in the OR, patient is agreeable to schedule with covering provider. OV scheduled for 03/31/19 at 11:30am with Melvia Heaps, CNM. [id number] prescreen negative, precautions reviewed. Advised I will review with Melvia Heaps, CNM  and advise if additional Valtrex recommended. Patient verbalizes understanding and is agreeable.   Melvia Heaps, CNM -please review and advise.   Cc: Dr. Talbert Nan

## 2019-03-28 NOTE — Progress Notes (Signed)
Patient is here in-person for PV. Patient denies any allergies to eggs or soy. Patient denies any problems with anesthesia/sedation. Patient denies any oxygen use at home. Patient denies taking any diet/weight loss medications or blood thinners. Patient is not being treated for MRSA or C-diff. EMMI education assisgned to the patient for the procedure, this was explained and instructions given to patient. COVID-19 screening test is on 12/14 3 pm, the pt is aware. Pt is aware that care partner will wait in the car during procedure; if they feel like they will be too hot or cold to wait in the car; they may wait in the 4 th floor lobby. Patient is aware to bring only one care partner. We want them to wear a mask (we do not have any that we can provide them), practice social distancing, and we will check their temperatures when they get here.  I did remind the patient that their care partner needs to stay in the parking lot the entire time and have a cell phone available, we will call them when the pt is ready for discharge. Patient will wear mask into building.

## 2019-03-31 ENCOUNTER — Ambulatory Visit: Payer: Self-pay | Admitting: Certified Nurse Midwife

## 2019-03-31 ENCOUNTER — Encounter: Payer: Self-pay | Admitting: Certified Nurse Midwife

## 2019-03-31 NOTE — Telephone Encounter (Signed)
Patient is scheduled for OV this morning at 11:30am with Melvia Heaps, CNM.  Routing to [employer], CNM FYI.   Encounter closed.

## 2019-04-01 LAB — SARS CORONAVIRUS 2 (TAT 6-24 HRS): SARS Coronavirus 2: NEGATIVE

## 2019-04-02 ENCOUNTER — Other Ambulatory Visit: Payer: Self-pay

## 2019-04-02 ENCOUNTER — Encounter: Payer: Self-pay | Admitting: Certified Nurse Midwife

## 2019-04-02 ENCOUNTER — Ambulatory Visit: Payer: Managed Care, Other (non HMO) | Admitting: Certified Nurse Midwife

## 2019-04-02 VITALS — BP 118/78 | HR 68 | Temp 97.0°F | Resp 16 | Wt 177.0 lb

## 2019-04-02 DIAGNOSIS — B009 Herpesviral infection, unspecified: Secondary | ICD-10-CM

## 2019-04-02 DIAGNOSIS — N898 Other specified noninflammatory disorders of vagina: Secondary | ICD-10-CM

## 2019-04-02 MED ORDER — VALACYCLOVIR HCL 500 MG PO TABS: ORAL_TABLET | ORAL | 2 refills | Status: DC

## 2019-04-02 NOTE — Progress Notes (Signed)
58 y.o. Married Caucasian female G2P1011 post menopausal here with complaint of vulva symptoms of pain and slight increase pink, slight burning. Has noted increase slightly watery  discharge. Recent discovery of HSV 2 on exam on 03/18/2019. No history of HSV that she or spouse was of aware. Given Rx for Valtrex for 3 days, symptoms have continued, but have decreased. Spouse immune compromised patient and she is concerned that she might put him at risk. Has not been sexually active.  and has taken Valtrex. Describes increase slightly odorous discharge, no odor. Onset of symptoms 30  days ago. Denies new personal products or vaginal dryness.  No other STD concerns. Urinary symptoms no change.  Review of Systems  Constitutional: Negative.   HENT: Negative.   Eyes: Negative.   Respiratory: Negative.   Cardiovascular: Negative.   Gastrointestinal: Negative.   Genitourinary: Negative.   Musculoskeletal: Negative.   Skin:       Vaginal discharge & pain in groin area  Neurological: Negative.   Endo/Heme/Allergies: Negative.   Psychiatric/Behavioral: Negative.    Chaperone present: Terence Lux  O:Healthy female WDWN Affect: normal, orientation x 3 POCT urine negative  Exam: Skin:warm and dry Abdomen:soft, non tender, no masses or rebound or guarding  Inguinal Lymph nodes: no enlargement or slight tenderness only bilateral Pelvic exam: External genital: normal female, with vulva slight increase pink on right with scaling from what appears to be healing HSV occurrence, left vulva normal, Inside right thigh area small healing 2 cm area appears to from chafing of legs together, no typical HSV appearance BUS: negative, Bladder non tender Vagina: watery ? odorous discharge noted.  Affirm taken Cervix/uterus: surgically absent  Adnexa: surgically absent, no masses or fullness noted Inside right thigh small healing 2 cm area appears to be from chafing of legs together, no typical HSV  appearance Rectal area: normal appearance, no lesions   A:Normal pelvic exam History of recent HSV 2 initial outbreak R/O vaginal infection Urine negative ? Area of chafing inside thigh vs healing HSV 2    P:Discussed findings of vaginal inspection and area of ? Chafing (shown to patient in mirror) and etiology. Patient will monitor and advise if does not clear or changes. Discussed Aveeno  sitz bath for comfort.  Coconut Oil use for skin protection prior to activity can be used to external skin for protection or dryness. Discussed HSV 2 treatment and suppression. Patient does not feel it has resolved. She also is worried about spouse exposure. Condom use recommended, if concerns. Reviewed etiology of HSV 2 and expectations. Feels better now after discussion.  Plan to start back on Valtrex twice daily for 2 weeks and then decrease to once daily for suppression. She can continue on suppression if concerns and no problems with medication use. If she notes outbreak or increase in symptoms needs to take twice daily x 3 days and advise if concerns. Rx updated with refill amount. Lab: affirm   Rv prn

## 2019-04-02 NOTE — Patient Instructions (Signed)
Read about HSV 2 at [facility] website

## 2019-04-03 LAB — VAGINITIS/VAGINOSIS, DNA PROBE
Candida Species: NEGATIVE
Gardnerella vaginalis: NEGATIVE
Trichomonas vaginosis: NEGATIVE

## 2019-04-07 ENCOUNTER — Other Ambulatory Visit: Payer: Self-pay | Admitting: Gastroenterology

## 2019-04-07 ENCOUNTER — Ambulatory Visit (INDEPENDENT_AMBULATORY_CARE_PROVIDER_SITE_OTHER): Payer: Managed Care, Other (non HMO)

## 2019-04-07 DIAGNOSIS — Z1159 Encounter for screening for other viral diseases: Secondary | ICD-10-CM

## 2019-04-08 LAB — SARS CORONAVIRUS 2 (TAT 6-24 HRS): SARS Coronavirus 2: NEGATIVE

## 2019-04-10 ENCOUNTER — Encounter: Payer: Self-pay | Admitting: Gastroenterology

## 2019-04-10 ENCOUNTER — Ambulatory Visit (AMBULATORY_SURGERY_CENTER): Payer: Managed Care, Other (non HMO) | Admitting: Gastroenterology

## 2019-04-10 ENCOUNTER — Other Ambulatory Visit: Payer: Self-pay

## 2019-04-10 VITALS — BP 130/73 | HR 51 | Temp 98.3°F | Resp 11 | Ht 67.0 in | Wt 176.0 lb

## 2019-04-10 DIAGNOSIS — K449 Diaphragmatic hernia without obstruction or gangrene: Secondary | ICD-10-CM

## 2019-04-10 DIAGNOSIS — K2270 Barrett's esophagus without dysplasia: Secondary | ICD-10-CM

## 2019-04-10 DIAGNOSIS — K2970 Gastritis, unspecified, without bleeding: Secondary | ICD-10-CM

## 2019-04-10 MED ORDER — SODIUM CHLORIDE 0.9 % IV SOLN: 500.0000 mL | Freq: Once | INTRAVENOUS | Status: DC

## 2019-04-10 MED ORDER — OMEPRAZOLE 40 MG PO CPDR: 40.0000 mg | DELAYED_RELEASE_CAPSULE | Freq: Every day | ORAL | 3 refills | Status: DC

## 2019-04-10 NOTE — Patient Instructions (Signed)
Information on hiatal hernias given to you today.  Await pathology results.  Switch to Omeprazole 40 mg once a day.  YOU HAD AN ENDOSCOPIC PROCEDURE TODAY AT [facility]:   Refer to the procedure report that was given to you for any specific questions about what was found during the examination.  If the procedure report does not answer your questions, please call your gastroenterologist to clarify.  If you requested that your care partner not be given the details of your procedure findings, then the procedure report has been included in a sealed envelope for you to review at your convenience later.  YOU SHOULD EXPECT: Some feelings of bloating in the abdomen. Passage of more gas than usual.  Walking can help get rid of the air that was put into your GI tract during the procedure and reduce the bloating. If you had a lower endoscopy (such as a colonoscopy or flexible sigmoidoscopy) you may notice spotting of blood in your stool or on the toilet paper. If you underwent a bowel prep for your procedure, you may not have a normal bowel movement for a few days.  Please Note:  You might notice some irritation and congestion in your nose or some drainage.  This is from the oxygen used during your procedure.  There is no need for concern and it should clear up in a day or so.  SYMPTOMS TO REPORT IMMEDIATELY:   Following lower endoscopy (colonoscopy or flexible sigmoidoscopy):  Excessive amounts of blood in the stool  Significant tenderness or worsening of abdominal pains  Swelling of the abdomen that is new, acute  Fever of 100F or higher For urgent or emergent issues, a gastroenterologist can be reached at any hour by calling [phone].   DIET:  We do recommend a small meal at first, but then you may proceed to your regular diet.  Drink plenty of fluids but you should avoid alcoholic beverages for 24 hours.  ACTIVITY:  You should plan to take it easy for the rest of today and  you should NOT DRIVE or use heavy machinery until tomorrow (because of the sedation medicines used during the test).    FOLLOW UP: Our staff will call the number listed on your records 48-72 hours following your procedure to check on you and address any questions or concerns that you may have regarding the information given to you following your procedure. If we do not reach you, we will leave a message.  We will attempt to reach you two times.  During this call, we will ask if you have developed any symptoms of COVID 19. If you develop any symptoms (ie: fever, flu-like symptoms, shortness of breath, cough etc.) before then, please call [phone].  If you test positive for Covid 19 in the 2 weeks post procedure, please call and report this information to [place].    If any biopsies were taken you will be contacted by phone or by letter within the next 1-3 weeks.  Please call us at [phone] if you have not heard about the biopsies in 3 weeks.    SIGNATURES/CONFIDENTIALITY: You and/or your care partner have signed paperwork which will be entered into your electronic medical record.  These signatures attest to the fact that that the information above on your After Visit Summary has been reviewed and is understood.  Full responsibility of the confidentiality of this discharge information lies with you and/or your care-partner.

## 2019-04-10 NOTE — Progress Notes (Signed)
Temp-JB VS-CW  Pt's states no medical or surgical changes since previsit or office visit.  

## 2019-04-10 NOTE — Op Note (Signed)
[facility] Patient Name: Julie Hopkins Procedure Date: 04/10/2019 10:03 AM MRN: [id number] Endoscopist: Thornton Park MD, MD Age: 58 Referring MD:  Date of Birth: Sep 30, 1960 Gender: Female Account #: [id number] Procedure:                Upper GI endoscopy Indications:              Follow-up of Barrett's esophagus                           Irregular z-line identified on EGD 11/08/18.                            Treatment with PPI x 6 months with repeat EGD                            planned to confirm diagnosis. Medicines:                Monitored Anesthesia Care Procedure:                Pre-Anesthesia Assessment:                           - Prior to the procedure, a History and Physical                            was performed, and patient medications and                            allergies were reviewed. The patient's tolerance of                            previous anesthesia was also reviewed. The risks                            and benefits of the procedure and the sedation                            options and risks were discussed with the patient.                            All questions were answered, and informed consent                            was obtained. Prior Anticoagulants: The patient has                            taken no previous anticoagulant or antiplatelet                            agents. ASA Grade Assessment: II - A patient with                            mild systemic disease. After reviewing the risks  and benefits, the patient was deemed in                            satisfactory condition to undergo the procedure.                           After obtaining informed consent, the endoscope was                            passed under direct vision. Throughout the                            procedure, the patient's blood pressure, pulse, and                            oxygen saturations were monitored continuously.  The                            Endoscope was introduced through the mouth, and                            advanced to the third part of duodenum. The upper                            GI endoscopy was accomplished without difficulty.                            The patient tolerated the procedure well. Scope In: Scope Out: Findings:                 The Z-line was irregular and was found 39 cm from                            the incisors. There are no tongues or islands.                            Multiple biopsies were taken with a cold forceps                            for histology. Estimated blood loss was minimal.                           A small hiatal hernia was present.                           The stomach was normal.                           The examined duodenum was normal.                           The cardia and gastric fundus were normal on                            retroflexion.  Complications:            No immediate complications. Estimated blood loss:                            Minimal. Estimated Blood Loss:     Estimated blood loss was minimal. Impression:               - Z-line irregular, 39 cm from the incisors.                            Biopsied.                           - Small hiatal hernia.                           - Normal stomach.                           - Normal examined duodenum. Recommendation:           - Patient has a contact number available for                            emergencies. The signs and symptoms of potential                            delayed complications were discussed with the                            patient. Return to normal activities tomorrow.                            Written discharge instructions were provided to the                            patient.                           - Resume previous diet.                           - Continue present medications. Continue                            pantoprazole 40 mg daily.                            - Await pathology results. Consider repeat EGD in                            3-5 years if the biopsy results confirm Barrett's                            esophagus. Thornton Park MD, MD 04/10/2019 10:22:36 AM This report has been signed electronically.

## 2019-04-10 NOTE — Progress Notes (Signed)
To PACU, VSS. Report to Rn.tb 

## 2019-04-11 ENCOUNTER — Ambulatory Visit: Payer: Self-pay

## 2019-04-11 ENCOUNTER — Ambulatory Visit: Payer: Managed Care, Other (non HMO) | Admitting: Family Medicine

## 2019-04-11 ENCOUNTER — Encounter: Payer: Self-pay | Admitting: Family Medicine

## 2019-04-11 DIAGNOSIS — M79604 Pain in right leg: Secondary | ICD-10-CM | POA: Diagnosis not present

## 2019-04-11 MED ORDER — MELOXICAM 15 MG PO TABS: 7.5000 mg | ORAL_TABLET | Freq: Every day | ORAL | 6 refills | Status: DC | PRN

## 2019-04-11 NOTE — Progress Notes (Signed)
Office Visit Note   Patient: Julie Hopkins           Date of Birth: 20-Aug-1960           MRN: [id number] Visit Date: 04/11/2019 Requested by: No referring provider defined for this encounter. PCP: Orma Flaming, MD  Subjective: Chief Complaint  Patient presents with  . Right Thigh - Pain    Pain in the hamstring area. Is a runner. Felt some irritation in that area with running, but then felt a pop there when she squatted halfway to hold her puppy and the puppy quickly lunged forward -- about 5-6 weeks ago.    HPI: She is here with right posterior thigh pain.  About 2 months ago she was walking her dog 1 day, she squatted down to hold her dog but the dog lunged forward and she fell forward a little bit and felt something pop in the buttocks area.  This was about 5 to 6 weeks ago.  She was getting better but then recently she had a new injury.  Then she tried jogging and tripped on a stump, felt a pop and immediate pain in the same area.  She has been feeling pain since then.              ROS: No back pain, no bruising.  All other systems were reviewed and are negative.  Objective: Vital Signs: There were no vitals taken for this visit.  Physical Exam:  General:  Alert and oriented, in no acute distress. Pulm:  Breathing unlabored. Psy:  Normal mood, congruent affect. Skin: No bruising or rash. Right leg: She is point tender at the proximal hamstring tendon at the ischial tuberosity.  There is no palpable defect in the muscle.  She has pain with flexion of the knee and extension of the hip against resistance.  Remainder of lower extremity strength is normal.  Imaging: Limited diagnostic ultrasound right posterior hip: Proximal hamstring tendon appears to be intact, I do not see any tears at the ischial tuberosity.  No obvious muscle defect.  Assessment & Plan: 1.  Right proximal hamstring strain -Physical therapy at [facility], meloxicam as needed.  Anticipate 6 to 12  weeks healing time.  Return as needed.     Procedures: No procedures performed  No notes on file     PMFS History: Patient Active Problem List   Diagnosis Date Noted  . Depression, recurrent ([place]) 04/29/2018  . Hair loss 04/29/2018  . Primary insomnia 04/29/2018  . Seasonal allergic rhinitis 04/29/2018  . Situational anxiety 04/29/2018   Past Medical History:  Diagnosis Date  . Anemia   . Depression   . GERD (gastroesophageal reflux disease)   . Heart murmur   . Post-operative nausea and vomiting     Family History  Problem Relation Age of Onset  . Cancer Mother   . COPD Mother   . Early death Mother   . Heart disease Mother   . Miscarriages / [place] Mother   . Alcohol abuse Father   . Depression Father   . Alcohol abuse Brother   . Early death Brother   . Heart attack Brother   . Hypertension Brother   . Uterine cancer Paternal Grandmother   . Early death Paternal Grandfather   . Depression Daughter   . Alcohol abuse Brother   . Depression Brother   . Heart disease Brother   . Colon cancer Neg Hx   . Pancreatic cancer Neg  Hx   . Rectal cancer Neg Hx   . Stomach cancer Neg Hx   . Esophageal cancer Neg Hx   . Colon polyps Neg Hx     Past Surgical History:  Procedure Laterality Date  . ABDOMINAL HYSTERECTOMY  2005  . COLONOSCOPY  11/08/2018  . GYNECOLOGIC CRYOSURGERY    . MYOMECTOMY    . SALPINGOOPHORECTOMY     both at 2 different surgeries  . UPPER GASTROINTESTINAL ENDOSCOPY  11/08/2018  . WISDOM TOOTH EXTRACTION     Social History   Occupational History  . Occupation: Retired  Tobacco Use  . Smoking status: Former Smoker    Types: Cigarettes    Quit date: 02/07/1998    Years since quitting: 21.1  . Smokeless tobacco: Never Used  Substance and Sexual Activity  . Alcohol use: Yes    Alcohol/week: 1.0 - 2.0 standard drinks    Types: 1 - 2 Glasses of wine per week    Comment: occasionally  . Drug use: Never  . Sexual activity: Yes     Birth control/protection: Post-menopausal

## 2019-04-14 ENCOUNTER — Telehealth: Payer: Self-pay

## 2019-04-14 NOTE — Telephone Encounter (Signed)
  Follow up Call-  Call back number 04/10/2019 11/08/2018  Post procedure Call Back phone  # [phone]  Permission to leave phone message Yes Yes  Some recent data might be hidden     Patient questions:  Do you have a fever, pain , or abdominal swelling? No. Pain Score  0 *  Have you tolerated food without any problems? Yes.    Have you been able to return to your normal activities? Yes.    Do you have any questions about your discharge instructions: Diet   No. Medications  No. Follow up visit  No.  Do you have questions or concerns about your Care? No.  Actions: * If pain score is 4 or above: No action needed, pain <4. 1. Have you developed a fever since your procedure? no  2.   Have you had an respiratory symptoms (SOB or cough) since your procedure? no  3.   Have you tested positive for COVID 19 since your procedure no  4.   Have you had any family members/close contacts diagnosed with the COVID 19 since your procedure?  no   If yes to any of these questions please route to Joylene John, RN and Alphonsa Gin, Therapist, sports.

## 2019-04-23 ENCOUNTER — Telehealth: Payer: Self-pay | Admitting: Certified Nurse Midwife

## 2019-04-23 NOTE — Telephone Encounter (Signed)
Left message to call Jeret Goyer, RN at [facility] [phone].   

## 2019-04-23 NOTE — Telephone Encounter (Signed)
Patient sent the following correspondence through [facility].  Good morning,  I have tried the estradiol patch for two months now. I am having problems sleeping even after switching to applying at night. Also, I feel emotional irritated as well as my mind feels foggy. Definitely not as bad as before I took the estradiol pills but definitely not something I want to live with. I would like to either go back to the estradiol pill or any other suggestion you may have.  Thank you,  Julie Hopkins

## 2019-04-24 NOTE — Telephone Encounter (Signed)
Spoke with patient. Patient reports she has been on estradiol 0.0375mg  patch for approximately 2 months. She reports feeling "emotionaly irritated, fogginess and difficulty sleeping" since switching to the patch. Denies any other GYN symptoms. Patient is requesting to switch back to oral estradiol  1mg  tab, or recommended alternative.   Confirmed pharmacy on file.   Advised patient I will forward to Dr. Talbert Nan to review on 04/28/19 when she returns to the office. Patient verbalizes understanding and is agreeable to plan.   Routing to Dr. Talbert Nan.

## 2019-04-28 MED ORDER — ESTRADIOL 1 MG PO TABS: 1.0000 mg | ORAL_TABLET | Freq: Every day | ORAL | 3 refills | Status: DC

## 2019-04-28 NOTE — Telephone Encounter (Signed)
Call to patient, left detailed message, ok per dpr. Advised as seen below per Dr. Talbert Nan. Advised Rx has been sent to [facility], [place]. Return call to office if any additional questions.   Encounter closed.

## 2019-04-28 NOTE — Telephone Encounter (Signed)
I will call in the oral estrogen for her. At some point she should try taking 1/2 of a tablet a day and see how she feels on it.

## 2019-05-19 ENCOUNTER — Encounter: Payer: Self-pay | Admitting: Family Medicine

## 2019-05-26 ENCOUNTER — Other Ambulatory Visit: Payer: Self-pay | Admitting: Certified Nurse Midwife

## 2019-05-26 DIAGNOSIS — B009 Herpesviral infection, unspecified: Secondary | ICD-10-CM

## 2019-05-26 NOTE — Telephone Encounter (Signed)
Non-Urgent Medical Question Received: Today Message Contents  Julie Hopkins, Julie "[name]" sent to [facility]  Phone Number: [phone]  Good morning,   I am taking the Valcyclovir as directed. I will need another prescription. May I receive one for a 90 day supply? My pharmacy has changed to [facility] at [place]. [place]   Thank you   Jerene Pitch

## 2019-05-26 NOTE — Telephone Encounter (Signed)
Medication refill request: valtrex 500mg  Last AEX:  03-04-2019 Next AEX: not scheduled Last MMG (if hormonal medication request): n/a Refill authorized: last refilled 04-02-2019. Patient requesting a 90 day rx to [facility] [address]. Please approve & put number or pills with refills if appropriate

## 2019-05-27 MED ORDER — VALACYCLOVIR HCL 500 MG PO TABS: ORAL_TABLET | ORAL | 2 refills | Status: DC

## 2019-05-27 NOTE — Telephone Encounter (Signed)
Patient is aware her rx was sent in.

## 2019-06-06 ENCOUNTER — Other Ambulatory Visit: Payer: Self-pay

## 2019-06-06 ENCOUNTER — Encounter: Payer: Self-pay | Admitting: Family Medicine

## 2019-06-06 ENCOUNTER — Ambulatory Visit (INDEPENDENT_AMBULATORY_CARE_PROVIDER_SITE_OTHER): Payer: Managed Care, Other (non HMO) | Admitting: Family Medicine

## 2019-06-06 VITALS — BP 110/66 | HR 62 | Temp 97.8°F | Ht 67.0 in | Wt 172.6 lb

## 2019-06-06 DIAGNOSIS — D708 Other neutropenia: Secondary | ICD-10-CM

## 2019-06-06 DIAGNOSIS — E559 Vitamin D deficiency, unspecified: Secondary | ICD-10-CM | POA: Diagnosis not present

## 2019-06-06 DIAGNOSIS — Z13220 Encounter for screening for lipoid disorders: Secondary | ICD-10-CM | POA: Diagnosis not present

## 2019-06-06 DIAGNOSIS — A6000 Herpesviral infection of urogenital system, unspecified: Secondary | ICD-10-CM | POA: Insufficient documentation

## 2019-06-06 DIAGNOSIS — F339 Major depressive disorder, recurrent, unspecified: Secondary | ICD-10-CM

## 2019-06-06 LAB — CBC WITH DIFFERENTIAL/PLATELET
Basophils Absolute: 0 10*3/uL (ref 0.0–0.1)
Basophils Relative: 0.8 % (ref 0.0–3.0)
Eosinophils Absolute: 0 10*3/uL (ref 0.0–0.7)
Eosinophils Relative: 0.9 % (ref 0.0–5.0)
HCT: 39.5 % (ref 36.0–46.0)
Hemoglobin: 13.5 g/dL (ref 12.0–15.0)
Lymphocytes Relative: 35.5 % (ref 12.0–46.0)
Lymphs Abs: 1.3 10*3/uL (ref 0.7–4.0)
MCHC: 34.1 g/dL (ref 30.0–36.0)
MCV: 98.3 fl (ref 78.0–100.0)
Monocytes Absolute: 0.3 10*3/uL (ref 0.1–1.0)
Monocytes Relative: 9 % (ref 3.0–12.0)
Neutro Abs: 2 10*3/uL (ref 1.4–7.7)
Neutrophils Relative %: 53.8 % (ref 43.0–77.0)
Platelets: 236 10*3/uL (ref 150.0–400.0)
RBC: 4.02 Mil/uL (ref 3.87–5.11)
RDW: 14.6 % (ref 11.5–15.5)
WBC: 3.7 10*3/uL — ABNORMAL LOW (ref 4.0–10.5)

## 2019-06-06 LAB — LIPID PANEL
Cholesterol: 229 mg/dL — ABNORMAL HIGH (ref 0–200)
HDL: 75.1 mg/dL (ref >39.00)
LDL Cholesterol: 139 mg/dL — ABNORMAL HIGH (ref 0–99)
NonHDL: 153.98
Total CHOL/HDL Ratio: 3
Triglycerides: 74 mg/dL (ref 0.0–149.0)
VLDL: 14.8 mg/dL (ref 0.0–40.0)

## 2019-06-06 LAB — COMPREHENSIVE METABOLIC PANEL
ALT: 17 U/L (ref 0–35)
AST: 19 U/L (ref 0–37)
Albumin: 4.4 g/dL (ref 3.5–5.2)
Alkaline Phosphatase: 46 U/L (ref 39–117)
BUN: 17 mg/dL (ref 6–23)
CO2: 28 mEq/L (ref 19–32)
Calcium: 9.4 mg/dL (ref 8.4–10.5)
Chloride: 105 mEq/L (ref 96–112)
Creatinine, Ser: 0.75 mg/dL (ref 0.40–1.20)
GFR: 78.75 mL/min (ref >60.00)
Glucose, Bld: 81 mg/dL (ref 70–99)
Potassium: 4.4 mEq/L (ref 3.5–5.1)
Sodium: 140 mEq/L (ref 135–145)
Total Bilirubin: 0.5 mg/dL (ref 0.2–1.2)
Total Protein: 6.8 g/dL (ref 6.0–8.3)

## 2019-06-06 LAB — VITAMIN D 25 HYDROXY (VIT D DEFICIENCY, FRACTURES): VITD: 44.36 ng/mL (ref 30.00–100.00)

## 2019-06-06 NOTE — Progress Notes (Signed)
Patient: Julie Hopkins MRN: [id number] DOB: [birth year] PCP: Orma Flaming, MD     Subjective:  Chief Complaint  Patient presents with  . Transitions Of Care    HPI: The patient is a 59 y.o. female who presents today for transition of care. She has past medical history significant for seasonal allergic rhinitis, recurrent depression, hair loss, insomnia and situational anxiety. Newly diagnosed genital herpes. On daily suppressive valacyclovir from gyn. She is also here for repeat labs due to leukopenia. Also needs vitamin D rechecked. Saw hematology and was told it was fine. She would still like to check this today.   She is up to date on all of her HM.   Depression: She is currently on wellbutrin 300mg . She states she was diagnosed about 2 years ago. She states her husband had a liver transplant due to alcoholism and quit drinking and a year after his transplant started drinking again. She states it's really hard to get past this and it's hard to deal with. Its very overwhelming. Her marriage is great and he is wonderful, but it's hard to get over that he is drinking again.   Situational anxiety: secondary to above. No medication.    Review of Systems  Constitutional: Negative for chills, fatigue and fever.  HENT: Negative for dental problem, ear pain, hearing loss, sinus pressure, sinus pain, sore throat and trouble swallowing.   Eyes: Negative for visual disturbance.  Respiratory: Negative for cough, chest tightness and shortness of breath.   Cardiovascular: Negative for chest pain, palpitations and leg swelling.  Gastrointestinal: Negative for abdominal pain, blood in stool, diarrhea and nausea.  Endocrine: Negative for cold intolerance, polydipsia, polyphagia and polyuria.  Genitourinary: Negative for dysuria and hematuria.  Musculoskeletal: Negative for arthralgias.  Skin: Negative for rash.  Neurological: Negative for dizziness, syncope and headaches.   Psychiatric/Behavioral: Negative for dysphoric mood and sleep disturbance. The patient is not nervous/anxious.     Allergies Patient is allergic to morphine sulfate and sulfa antibiotics.  Past Medical History Patient  has a past medical history of Anemia, Depression, GERD (gastroesophageal reflux disease), Heart murmur, and Post-operative nausea and vomiting.  Surgical History Patient  has a past surgical history that includes Abdominal hysterectomy (2005); Wisdom tooth extraction; Salpingoophorectomy; Myomectomy; Gynecologic cryosurgery; Upper gastrointestinal endoscopy (11/08/2018); and Colonoscopy (11/08/2018).  Family History Pateint's family history includes Alcohol abuse in her brother, brother, and father; COPD in her mother; Cancer in her mother; Depression in her brother, daughter, and father; Early death in her brother, mother, and paternal grandfather; Heart attack in her brother; Heart disease in her brother and mother; Hypertension in her brother; Miscarriages / [place] in her mother; Uterine cancer in her paternal grandmother.  Social History Patient  reports that she quit smoking about 21 years ago. Her smoking use included cigarettes. She has never used smokeless tobacco. She reports current alcohol use of about 1.0 - 2.0 standard drinks of alcohol per week. She reports that she does not use drugs.    Objective: Vitals:   06/06/19 1017  BP: 110/66  Pulse: 62  Temp: 97.8 F (36.6 C)  TempSrc: Temporal  SpO2: 97%  Weight: 172 lb 9.6 oz (78.3 kg)  Height: 5\' 7"  (1.702 m)    Body mass index is 27.03 kg/m.  Physical Exam Vitals reviewed.  Constitutional:      Appearance: Normal appearance. She is well-developed and normal weight.  HENT:     Head: Normocephalic and atraumatic.     Right Ear:  Tympanic membrane, ear canal and external ear normal.     Left Ear: Tympanic membrane, ear canal and external ear normal.     Nose: Nose normal.     Mouth/Throat:      Mouth: Mucous membranes are moist.  Eyes:     Extraocular Movements: Extraocular movements intact.     Conjunctiva/sclera: Conjunctivae normal.     Pupils: Pupils are equal, round, and reactive to light.  Neck:     Thyroid: No thyromegaly.  Cardiovascular:     Rate and Rhythm: Normal rate and regular rhythm.     Heart sounds: Normal heart sounds. No murmur.  Pulmonary:     Effort: Pulmonary effort is normal.     Breath sounds: Normal breath sounds.  Abdominal:     General: Abdomen is flat. Bowel sounds are normal. There is no distension.     Palpations: Abdomen is soft.     Tenderness: There is no abdominal tenderness.  Musculoskeletal:     Cervical back: Normal range of motion and neck supple.  Lymphadenopathy:     Cervical: No cervical adenopathy.  Skin:    General: Skin is warm and dry.     Capillary Refill: Capillary refill takes less than 2 seconds.     Findings: No rash.  Neurological:     General: No focal deficit present.     Mental Status: She is alert and oriented to person, place, and time.     Cranial Nerves: No cranial nerve deficit.     Coordination: Coordination normal.     Deep Tendon Reflexes: Reflexes normal.  Psychiatric:        Mood and Affect: Mood normal.        Behavior: Behavior normal.         Office Visit from 06/06/2019 in [facility]  PHQ-9 Total Score  3         Assessment/plan: 1. Depression, recurrent ([place]) phq9 score very well controlled on current medication. Continue current treatment. No refills needed at this time. F/u in 6 months.   2. Vitamin D deficiency  - VITAMIN D 25 Hydroxy (Vit-D Deficiency, Fractures)  3. Other neutropenia (HCC) Has already seen hematology with negative work up. She would liked checked again today.  - CBC with Differential/Platelet - Comprehensive metabolic panel  4. Screening for lipid disorders  - Lipid panel  Fu for annual in 6 months time.   Chart reviewed with  patient including HM, problem lists, allergies, medication.   Total time of encounter: 32 minutes total time of encounter, including 23 minutes spent in face-to-face patient care. This time includes coordination of care and counseling regarding chart review, chronic problems, plan of care. Remainder of non-face-to-face time involved reviewing chart documents/testing relevant to the patient encounter and documentation in the medical record.  This visit occurred during the SARS-CoV-2 public health emergency.  Safety protocols were in place, including screening questions prior to the visit, additional usage of staff PPE, and extensive cleaning of exam room while observing appropriate contact time as indicated for disinfecting solutions.    Return if symptoms worsen or fail to improve.   Orma Flaming, MD [place]   06/06/2019

## 2019-06-06 NOTE — Patient Instructions (Signed)
-  rechecking labs, im not worried about your white count.   -would f/u for regular annual in 6-12 months since doing labs   So nice to meet you!  Dr. Rogers Blocker

## 2019-06-09 ENCOUNTER — Encounter: Payer: Self-pay | Admitting: Family Medicine

## 2019-06-09 DIAGNOSIS — E785 Hyperlipidemia, unspecified: Secondary | ICD-10-CM | POA: Insufficient documentation

## 2019-07-15 ENCOUNTER — Encounter: Payer: Self-pay | Admitting: Certified Nurse Midwife

## 2019-09-02 ENCOUNTER — Other Ambulatory Visit: Payer: Self-pay

## 2019-09-03 ENCOUNTER — Telehealth: Payer: Self-pay | Admitting: Family Medicine

## 2019-09-03 ENCOUNTER — Encounter: Payer: Self-pay | Admitting: Family Medicine

## 2019-09-03 ENCOUNTER — Ambulatory Visit: Payer: 59 | Admitting: Family Medicine

## 2019-09-03 VITALS — BP 110/60 | HR 55 | Temp 97.6°F | Ht 67.0 in | Wt 170.4 lb

## 2019-09-03 DIAGNOSIS — S90561A Insect bite (nonvenomous), right ankle, initial encounter: Secondary | ICD-10-CM

## 2019-09-03 DIAGNOSIS — W57XXXA Bitten or stung by nonvenomous insect and other nonvenomous arthropods, initial encounter: Secondary | ICD-10-CM

## 2019-09-03 MED ORDER — CEPHALEXIN 500 MG PO CAPS
500.0000 mg | ORAL_CAPSULE | Freq: Three times a day (TID) | ORAL | 0 refills | Status: DC
Start: 2019-09-03 — End: 2019-12-01

## 2019-09-03 MED ORDER — MUPIROCIN 2 % EX OINT: 1.0000 "application " | TOPICAL_OINTMENT | Freq: Three times a day (TID) | CUTANEOUS | 0 refills | Status: DC

## 2019-09-03 NOTE — Telephone Encounter (Signed)
Pt being seen today

## 2019-09-03 NOTE — Telephone Encounter (Signed)
Patient is scheduled for today.  Nurse Assessment Nurse: Neena Rhymes, RN, Sharyn Lull Date/Time (Eastern Time): 09/02/2019 3:34:19 PM Confirm and document reason for call. If symptomatic, describe symptoms. ---Pt may have bitten by a spider or ant on her right inner ankle that is sore and blistering up. Circle of pink around it size of golfball. She believes it happened yesterday while she was working in yard. Painful to touch and is itchy has clear yellow discharge. Able to bear weight. Has the patient had close contact with a person known or suspected to have the novel coronavirus illness OR traveled / lives in area with major community spread (including international travel) in the last 14 days from the onset of symptoms? * If Asymptomatic, screen for exposure and travel within the last 14 days. ---No Does the patient have any new or worsening symptoms? ---Yes Will a triage be completed? ---Yes Related visit to physician within the last 2 weeks? ---No Does the PT have any chronic conditions? (i.e. diabetes, asthma, this includes High risk factors for pregnancy, etc.) ---No Is this a behavioral health or substance abuse call? ---No Guidelines Guideline Title Affirmed Question Affirmed Notes Nurse Date/Time ([place] Time) [place] [1] Red or very tender (to touch) area AND [2] started over 24 hours after the bite Neena Rhymes, RNSharyn Lull 09/02/2019 3:38:00 PMPLEASE NOTE: All timestamps contained within this report are represented as [place] Standard Time. CONFIDENTIALTY NOTICE: This fax transmission is intended only for the addressee. It contains information that is legally privileged, confidential or otherwise protected from use or disclosure. If you are not the intended recipient, you are strictly prohibited from reviewing, disclosing, copying using or disseminating any of this information or taking any action in reliance on or regarding this information. If you have  received this fax in error, please notify us immediately by telephone so that we can arrange for its return to [place]. Phone: [phone], Toll-Free: [phone], Fax: [phone] Page: 2 of 2 Call Id: [id number] [place]. Time Eilene Ghazi Time) Disposition Final User 09/02/2019 3:41:29 PM See PCP within 24 Hours Yes Neena Rhymes, RN, Julio Sicks Disagree/Comply Comply Caller Understands Yes PreDisposition Call Doctor Care Advice Given Per Guideline SEE PCP WITHIN 24 HOURS: * Warm water soaks or compresses for 15 minutes, 3 times per day. * Apply an antibiotic ointment 3 times per day. * For pain relief, you can take either acetaminophen, ibuprofen, or naproxen. * You become worse. * Fever occurs CALL BACK IF

## 2019-09-03 NOTE — Progress Notes (Signed)
Patient: Julie Hopkins MRN: [id number] DOB: 08-02-1960 PCP: Orma Flaming, MD     Subjective:  Chief Complaint  Patient presents with  . Insect Bite    right ankle    HPI: The patient is a 59 y.o. female who presents today for an insect bite on right side of ankle. She thinks it happened the day before yesterday. Patient says that it has been draining and has some soreness. It itched last night but hasn't today. She has not had any itching today. She has had some clear-yellow drainage yesterday. She thinks the redness hasn't spread anymore from yesterday, but thinks it may be a bit more swollen. No fever/chills. No N/V. She leaves for [facility] tomorrow and just wants to make sure she doesn't need anything.   Review of Systems  HENT: Negative for sore throat and trouble swallowing.   Respiratory: Negative for cough, shortness of breath and wheezing.   Cardiovascular: Negative for chest pain and palpitations.  Endocrine: Negative for cold intolerance and heat intolerance.  Skin: Positive for wound.  Neurological: Negative for dizziness, light-headedness and headaches.    Allergies Patient is allergic to morphine sulfate and sulfa antibiotics.  Past Medical History Patient  has a past medical history of Anemia, Depression, GERD (gastroesophageal reflux disease), Heart murmur, and Post-operative nausea and vomiting.  Surgical History Patient  has a past surgical history that includes Abdominal hysterectomy (2005); Wisdom tooth extraction; Salpingoophorectomy; Myomectomy; Gynecologic cryosurgery; Upper gastrointestinal endoscopy (11/08/2018); and Colonoscopy (11/08/2018).  Family History Pateint's family history includes Alcohol abuse in her brother, brother, and father; COPD in her mother; Cancer in her mother; Depression in her brother, daughter, and father; Early death in her brother, mother, and paternal grandfather; Heart attack in her brother; Heart disease in her brother  and mother; Hypertension in her brother; Miscarriages / [place] in her mother; Uterine cancer in her paternal grandmother.  Social History Patient  reports that she quit smoking about 21 years ago. Her smoking use included cigarettes. She has never used smokeless tobacco. She reports current alcohol use of about 1.0 - 2.0 standard drinks of alcohol per week. She reports that she does not use drugs.    Objective: Vitals:   09/03/19 1124  BP: 110/60  Pulse: (!) 55  Temp: 97.6 F (36.4 C)  TempSrc: Temporal  SpO2: 98%  Weight: 170 lb 6.4 oz (77.3 kg)  Height: 5\' 7"  (1.702 m)    Body mass index is 26.69 kg/m.  Physical Exam Vitals reviewed.  Constitutional:      Appearance: Normal appearance. She is normal weight.  HENT:     Head: Normocephalic and atraumatic.  Skin:    General: Skin is warm.     Findings: Lesion (right ankle. small bite with scabbed over drainage. surrounding erythema and slight induration. very minimal ) present.  Neurological:     Mental Status: She is alert.        Assessment/plan: 1. Insect bite of right ankle, initial encounter Topical bactroban TID. I think this will heal up nicely with topical only, but if not getting better in 24-48 hours, increasing redness/drainage I want her to start oral keflex. Precautions given.     This visit occurred during the SARS-CoV-2 public health emergency.  Safety protocols were in place, including screening questions prior to the visit, additional usage of staff PPE, and extensive cleaning of exam room while observing appropriate contact time as indicated for disinfecting solutions.     Return if symptoms worsen or  fail to improve.   Orma Flaming, MD [place]   09/03/2019

## 2019-09-03 NOTE — Patient Instructions (Signed)

## 2019-09-18 ENCOUNTER — Other Ambulatory Visit: Payer: Self-pay | Admitting: Obstetrics and Gynecology

## 2019-09-18 DIAGNOSIS — Z1231 Encounter for screening mammogram for malignant neoplasm of breast: Secondary | ICD-10-CM

## 2019-09-25 ENCOUNTER — Other Ambulatory Visit: Payer: Self-pay

## 2019-09-25 ENCOUNTER — Ambulatory Visit (INDEPENDENT_AMBULATORY_CARE_PROVIDER_SITE_OTHER): Payer: 59 | Admitting: Family Medicine

## 2019-09-25 ENCOUNTER — Encounter: Payer: Self-pay | Admitting: Family Medicine

## 2019-09-25 VITALS — BP 98/60 | HR 54 | Temp 97.2°F | Ht 67.0 in | Wt 171.2 lb

## 2019-09-25 DIAGNOSIS — Z0000 Encounter for general adult medical examination without abnormal findings: Secondary | ICD-10-CM | POA: Diagnosis not present

## 2019-09-25 DIAGNOSIS — E559 Vitamin D deficiency, unspecified: Secondary | ICD-10-CM | POA: Diagnosis not present

## 2019-09-25 DIAGNOSIS — E782 Mixed hyperlipidemia: Secondary | ICD-10-CM

## 2019-09-25 DIAGNOSIS — R5383 Other fatigue: Secondary | ICD-10-CM

## 2019-09-25 LAB — COMPREHENSIVE METABOLIC PANEL
ALT: 13 U/L (ref 0–35)
AST: 18 U/L (ref 0–37)
Albumin: 4.4 g/dL (ref 3.5–5.2)
Alkaline Phosphatase: 54 U/L (ref 39–117)
BUN: 20 mg/dL (ref 6–23)
CO2: 29 mEq/L (ref 19–32)
Calcium: 9.4 mg/dL (ref 8.4–10.5)
Chloride: 102 mEq/L (ref 96–112)
Creatinine, Ser: 0.79 mg/dL (ref 0.40–1.20)
GFR: 74.09 mL/min (ref >60.00)
Glucose, Bld: 78 mg/dL (ref 70–99)
Potassium: 4.2 mEq/L (ref 3.5–5.1)
Sodium: 137 mEq/L (ref 135–145)
Total Bilirubin: 0.5 mg/dL (ref 0.2–1.2)
Total Protein: 6.7 g/dL (ref 6.0–8.3)

## 2019-09-25 LAB — CBC WITH DIFFERENTIAL/PLATELET
Basophils Absolute: 0 10*3/uL (ref 0.0–0.1)
Basophils Relative: 0.7 % (ref 0.0–3.0)
Eosinophils Absolute: 0 10*3/uL (ref 0.0–0.7)
Eosinophils Relative: 0.8 % (ref 0.0–5.0)
HCT: 38.7 % (ref 36.0–46.0)
Hemoglobin: 13.3 g/dL (ref 12.0–15.0)
Lymphocytes Relative: 34.6 % (ref 12.0–46.0)
Lymphs Abs: 1.3 10*3/uL (ref 0.7–4.0)
MCHC: 34.4 g/dL (ref 30.0–36.0)
MCV: 98.6 fl (ref 78.0–100.0)
Monocytes Absolute: 0.3 10*3/uL (ref 0.1–1.0)
Monocytes Relative: 8.7 % (ref 3.0–12.0)
Neutro Abs: 2.1 10*3/uL (ref 1.4–7.7)
Neutrophils Relative %: 55.2 % (ref 43.0–77.0)
Platelets: 227 10*3/uL (ref 150.0–400.0)
RBC: 3.93 Mil/uL (ref 3.87–5.11)
RDW: 14 % (ref 11.5–15.5)
WBC: 3.8 10*3/uL — ABNORMAL LOW (ref 4.0–10.5)

## 2019-09-25 LAB — LIPID PANEL
Cholesterol: 250 mg/dL — ABNORMAL HIGH (ref 0–200)
HDL: 89.2 mg/dL (ref >39.00)
LDL Cholesterol: 146 mg/dL — ABNORMAL HIGH (ref 0–99)
NonHDL: 160.57
Total CHOL/HDL Ratio: 3
Triglycerides: 72 mg/dL (ref 0.0–149.0)
VLDL: 14.4 mg/dL (ref 0.0–40.0)

## 2019-09-25 LAB — VITAMIN B12: Vitamin B-12: 218 pg/mL (ref 211–911)

## 2019-09-25 LAB — VITAMIN D 25 HYDROXY (VIT D DEFICIENCY, FRACTURES): VITD: 47.27 ng/mL (ref 30.00–100.00)

## 2019-09-25 LAB — TSH: TSH: 0.8 u[IU]/mL (ref 0.35–4.50)

## 2019-09-25 NOTE — Patient Instructions (Addendum)
Basic labs +vitamin D and b12. Watch your heart rate and if you get dizzy, light headed, etc. We will order a holter monitor for you to watch your rate.   Preventive Care 61-61 Years Old, Female Preventive care refers to visits with your health care provider and lifestyle choices that can promote health and wellness. This includes:  A yearly physical exam. This may also be called an annual well check.  Regular dental visits and eye exams.  Immunizations.  Screening for certain conditions.  Healthy lifestyle choices, such as eating a healthy diet, getting regular exercise, not using drugs or products that contain nicotine and tobacco, and limiting alcohol use. What can I expect for my preventive care visit? Physical exam Your health care provider will check your:  Height and weight. This may be used to calculate body mass index (BMI), which tells if you are at a healthy weight.  Heart rate and blood pressure.  Skin for abnormal spots. Counseling Your health care provider may ask you questions about your:  Alcohol, tobacco, and drug use.  Emotional well-being.  Home and relationship well-being.  Sexual activity.  Eating habits.  Work and work Statistician.  Method of birth control.  Menstrual cycle.  Pregnancy history. What immunizations do I need?  Influenza (flu) vaccine  This is recommended every year. Tetanus, diphtheria, and pertussis (Tdap) vaccine  You may need a Td booster every 10 years. Varicella (chickenpox) vaccine  You may need this if you have not been vaccinated. Zoster (shingles) vaccine  You may need this after age 28. Measles, mumps, and rubella (MMR) vaccine  You may need at least one dose of MMR if you were born in 1957 or later. You may also need a second dose. Pneumococcal conjugate (PCV13) vaccine  You may need this if you have certain conditions and were not previously vaccinated. Pneumococcal polysaccharide (PPSV23) vaccine  You  may need one or two doses if you smoke cigarettes or if you have certain conditions. Meningococcal conjugate (MenACWY) vaccine  You may need this if you have certain conditions. Hepatitis A vaccine  You may need this if you have certain conditions or if you travel or work in places where you may be exposed to hepatitis A. Hepatitis B vaccine  You may need this if you have certain conditions or if you travel or work in places where you may be exposed to hepatitis B. Haemophilus influenzae type b (Hib) vaccine  You may need this if you have certain conditions. Human papillomavirus (HPV) vaccine  If recommended by your health care provider, you may need three doses over 6 months. You may receive vaccines as individual doses or as more than one vaccine together in one shot (combination vaccines). Talk with your health care provider about the risks and benefits of combination vaccines. What tests do I need? Blood tests  Lipid and cholesterol levels. These may be checked every 5 years, or more frequently if you are over 12 years old.  Hepatitis C test.  Hepatitis B test. Screening  Lung cancer screening. You may have this screening every year starting at age 61 if you have a 30-pack-year history of smoking and currently smoke or have quit within the past 15 years.  Colorectal cancer screening. All adults should have this screening starting at age 61 and continuing until age 22. Your health care provider may recommend screening at age 61 if you are at increased risk. You will have tests every 1-10 years, depending on your  results and the type of screening test.  Diabetes screening. This is done by checking your blood sugar (glucose) after you have not eaten for a while (fasting). You may have this done every 1-3 years.  Mammogram. This may be done every 1-2 years. Talk with your health care provider about when you should start having regular mammograms. This may depend on whether you have a  family history of breast cancer.  BRCA-related cancer screening. This may be done if you have a family history of breast, ovarian, tubal, or peritoneal cancers.  Pelvic exam and Pap test. This may be done every 3 years starting at age 61. Starting at age 74, this may be done every 5 years if you have a Pap test in combination with an HPV test. Other tests  Sexually transmitted disease (STD) testing.  Bone density scan. This is done to screen for osteoporosis. You may have this scan if you are at high risk for osteoporosis. Follow these instructions at home: Eating and drinking  Eat a diet that includes fresh fruits and vegetables, whole grains, lean protein, and low-fat dairy.  Take vitamin and mineral supplements as recommended by your health care provider.  Do not drink alcohol if: ? Your health care provider tells you not to drink. ? You are pregnant, may be pregnant, or are planning to become pregnant.  If you drink alcohol: ? Limit how much you have to 0-1 drink a day. ? Be aware of how much alcohol is in your drink. In the [place] one drink equals one 12 oz bottle of beer (355 mL), one 5 oz glass of wine (148 mL), or one 1 oz glass of hard liquor (44 mL). Lifestyle  Take daily care of your teeth and gums.  Stay active. Exercise for at least 30 minutes on 5 or more days each week.  Do not use any products that contain nicotine or tobacco, such as cigarettes, e-cigarettes, and chewing tobacco. If you need help quitting, ask your health care provider.  If you are sexually active, practice safe sex. Use a condom or other form of birth control (contraception) in order to prevent pregnancy and STIs (sexually transmitted infections).  If told by your health care provider, take low-dose aspirin daily starting at age 61. What's next?  Visit your health care provider once a year for a well check visit.  Ask your health care provider how often you should have your eyes and teeth  checked.  Stay up to date on all vaccines. This information is not intended to replace advice given to you by your health care provider. Make sure you discuss any questions you have with your health care provider. Document Revised: 12/20/2017 Document Reviewed: 12/20/2017 Elsevier Patient Education  2020 [employer].

## 2019-09-25 NOTE — Progress Notes (Signed)
Patient: Julie Hopkins MRN: [id number] DOB: 06/23/1960 PCP: Orma Flaming, MD     Subjective:  Chief Complaint  Patient presents with  . Annual Exam  . Hyperlipidemia    HPI: The patient is a 59 y.o. female who presents today for annual exam. She denies any changes to past medical history. There have been no recent hospitalizations. They are following a well balanced diet and exercise plan. Weight has been fluctuating a bit. She states in the past few days she just hasn't felt 100%. She is fatigued and just feels off. Denies any night sweats, blood in stool, easy bruising/bleeding. She is utd on all of her HM.   Hyperlipidemia On no medication. Her ASCVD risk was 4.6% last year. she is on no medication and is doing lifestyle modifications. Will check today. She is fasting.   Depression Currently on wellbutrin 300mg  daily. She feels like this is working well for her and controlling her symptoms. She at one point would like to wean off of this, but now is not the time.    Immunization History  Administered Date(s) Administered  . Influenza,inj,Quad PF,6+ Mos 04/29/2018, 01/02/2019  . PFIZER SARS-COV-2 Vaccination 07/07/2019, 07/28/2019  . Tdap 10/01/2018   Colonoscopy: 11/08/2018. repeat in 10 years  Mammogram: 10/07/2018. Has scheduled for 10/08/2019 Pap smear: 02/2019. F/u in 3 years.    Review of Systems  Constitutional: Negative for chills, fatigue and fever.  HENT: Negative for dental problem, ear pain, hearing loss and trouble swallowing.   Eyes: Negative for visual disturbance.  Respiratory: Negative for cough, chest tightness and shortness of breath.   Cardiovascular: Negative for chest pain, palpitations and leg swelling.  Gastrointestinal: Negative for abdominal pain, blood in stool, diarrhea and nausea.  Endocrine: Negative for cold intolerance, polydipsia, polyphagia and polyuria.  Genitourinary: Negative for dysuria and hematuria.  Musculoskeletal:  Negative for arthralgias.  Skin: Negative for rash.  Neurological: Negative for dizziness and headaches.  Psychiatric/Behavioral: Negative for dysphoric mood and sleep disturbance. The patient is not nervous/anxious.     Patient is allergic to morphine sulfate and sulfa antibiotics.  Past Medical History Patient  has a past medical history of Anemia, Depression, GERD (gastroesophageal reflux disease), Heart murmur, and Post-operative nausea and vomiting.  Surgical History Patient  has a past surgical history that includes Abdominal hysterectomy (2005); Wisdom tooth extraction; Salpingoophorectomy; Myomectomy; Gynecologic cryosurgery; Upper gastrointestinal endoscopy (11/08/2018); and Colonoscopy (11/08/2018).  Family History Pateint's family history includes Alcohol abuse in her brother, brother, and father; COPD in her mother; Cancer in her mother; Depression in her brother, daughter, and father; Early death in her brother, mother, and paternal grandfather; Heart attack in her brother; Heart disease in her brother and mother; Hypertension in her brother; Miscarriages / [place] in her mother; Uterine cancer in her paternal grandmother.  Social History Patient  reports that she quit smoking about 21 years ago. Her smoking use included cigarettes. She has never used smokeless tobacco. She reports current alcohol use of about 1.0 - 2.0 standard drinks of alcohol per week. She reports that she does not use drugs.    Objective: Vitals:   09/25/19 0834 09/25/19 0900  BP: 98/60   Pulse: (!) 45 (!) 54  Temp: (!) 97.2 F (36.2 C)   TempSrc: Temporal   SpO2: 99%   Weight: 171 lb 3.2 oz (77.7 kg)   Height: 5\' 7"  (1.702 m)     Body mass index is 26.81 kg/m.  Physical Exam Vitals reviewed.  Constitutional:  Appearance: Normal appearance. She is well-developed and normal weight.  HENT:     Head: Normocephalic and atraumatic.     Right Ear: Tympanic membrane, ear canal and external  ear normal.     Left Ear: Tympanic membrane, ear canal and external ear normal.     Nose: Nose normal.     Mouth/Throat:     Mouth: Mucous membranes are moist.     Comments: Braces/rubberbands.  Eyes:     Extraocular Movements: Extraocular movements intact.     Conjunctiva/sclera: Conjunctivae normal.     Pupils: Pupils are equal, round, and reactive to light.  Neck:     Thyroid: No thyromegaly.     Vascular: No carotid bruit.  Cardiovascular:     Rate and Rhythm: Normal rate and regular rhythm.     Pulses: Normal pulses.     Heart sounds: Normal heart sounds. No murmur.  Pulmonary:     Effort: Pulmonary effort is normal.     Breath sounds: Normal breath sounds.  Abdominal:     General: Abdomen is flat. Bowel sounds are normal. There is no distension.     Palpations: Abdomen is soft.     Tenderness: There is no abdominal tenderness.  Musculoskeletal:     Cervical back: Normal range of motion and neck supple.  Lymphadenopathy:     Cervical: No cervical adenopathy.  Skin:    General: Skin is warm and dry.     Capillary Refill: Capillary refill takes less than 2 seconds.     Findings: No rash.  Neurological:     General: No focal deficit present.     Mental Status: She is alert and oriented to person, place, and time.     Cranial Nerves: No cranial nerve deficit.     Coordination: Coordination normal.     Deep Tendon Reflexes: Reflexes normal.  Psychiatric:        Mood and Affect: Mood normal.        Behavior: Behavior normal.           Office Visit from 06/06/2019 in [facility]  PHQ-9 Total Score  3      Assessment/plan: 1. Mixed hyperlipidemia The 10-year ASCVD risk score Mikey Bussing DC Jr., et al., 2013) is: 1.8% -repeat fasting today.  - Lipid panel  2. Annual physical exam Routine fasting labs today. HM reviewed and she is UTD. Continue healthy and active lifestyle. F/u in one year or as needed.  Patient counseling [x]    Nutrition:  Stressed importance of moderation in sodium/caffeine intake, saturated fat and cholesterol, caloric balance, sufficient intake of fresh fruits, vegetables, fiber, calcium, iron, and 1 mg of folate supplement per day (for females capable of pregnancy).  [x]    Stressed the importance of regular exercise.   []    Substance Abuse: Discussed cessation/primary prevention of tobacco, alcohol, or other drug use; driving or other dangerous activities under the influence; availability of treatment for abuse.   [x]    Injury prevention: Discussed safety belts, safety helmets, smoke detector, smoking near bedding or upholstery.   [x]    Sexuality: Discussed sexually transmitted diseases, partner selection, use of condoms, avoidance of unintended pregnancy  and contraceptive alternatives.  [x]    Dental health: Discussed importance of regular tooth brushing, flossing, and dental visits.  [x]    Health maintenance and immunizations reviewed. Please refer to Health maintenance section.    - CBC with Differential/Platelet - Comprehensive metabolic panel  3. Vitamin D deficiency  - VITAMIN  D 25 Hydroxy (Vit-D Deficiency, Fractures)  4. Other fatigue Checking labs/d/b12. No red flags on exam or in history. Heart rate is lower than normal, but at her baseline on repeat. She runs 50-55. She has no symptoms, but discussed if light headed, dizzy, etc. We will need to check holter. She will let me know and keep eye on this.  - TSH - Vitamin B12   This visit occurred during the SARS-CoV-2 public health emergency.  Safety protocols were in place, including screening questions prior to the visit, additional usage of staff PPE, and extensive cleaning of exam room while observing appropriate contact time as indicated for disinfecting solutions.      Return in about 1 year (around 09/24/2020) for or as needed. let me know about your heart rate .     Orma Flaming, MD [place]  09/25/2019

## 2019-09-26 ENCOUNTER — Encounter: Payer: Self-pay | Admitting: Family Medicine

## 2019-09-26 DIAGNOSIS — E538 Deficiency of other specified B group vitamins: Secondary | ICD-10-CM | POA: Insufficient documentation

## 2019-09-29 ENCOUNTER — Encounter: Payer: Self-pay | Admitting: Family Medicine

## 2019-09-30 ENCOUNTER — Other Ambulatory Visit: Payer: Self-pay

## 2019-09-30 ENCOUNTER — Ambulatory Visit (INDEPENDENT_AMBULATORY_CARE_PROVIDER_SITE_OTHER): Payer: 59

## 2019-09-30 DIAGNOSIS — E538 Deficiency of other specified B group vitamins: Secondary | ICD-10-CM | POA: Diagnosis not present

## 2019-09-30 MED ORDER — CYANOCOBALAMIN 1000 MCG/ML IJ SOLN
1000.0000 ug | Freq: Once | INTRAMUSCULAR | Status: AC
  Administered 2019-09-30: 1000 ug via INTRAMUSCULAR

## 2019-09-30 NOTE — Progress Notes (Signed)
Per orders of Dr.Parker, injection of Vitamin B 12 given by Nnenna Meador. Patient tolerated injection well.  

## 2019-10-03 ENCOUNTER — Encounter: Payer: Managed Care, Other (non HMO) | Admitting: Family Medicine

## 2019-10-06 ENCOUNTER — Encounter: Payer: Managed Care, Other (non HMO) | Admitting: Family Medicine

## 2019-10-08 ENCOUNTER — Ambulatory Visit
Admission: RE | Admit: 2019-10-08 | Discharge: 2019-10-08 | Disposition: A | Discharge status: Home / Self Care | Payer: 59 | Source: Ambulatory Visit

## 2019-10-08 ENCOUNTER — Other Ambulatory Visit: Payer: Self-pay

## 2019-10-08 DIAGNOSIS — Z1231 Encounter for screening mammogram for malignant neoplasm of breast: Secondary | ICD-10-CM

## 2019-11-14 ENCOUNTER — Other Ambulatory Visit: Payer: Self-pay | Admitting: Physician Assistant

## 2019-11-14 DIAGNOSIS — F339 Major depressive disorder, recurrent, unspecified: Secondary | ICD-10-CM

## 2019-12-01 ENCOUNTER — Other Ambulatory Visit: Payer: Self-pay

## 2019-12-01 ENCOUNTER — Encounter: Payer: Self-pay | Admitting: Family Medicine

## 2019-12-01 ENCOUNTER — Ambulatory Visit: Payer: 59 | Admitting: Family Medicine

## 2019-12-01 ENCOUNTER — Ambulatory Visit: Payer: Self-pay

## 2019-12-01 DIAGNOSIS — M79662 Pain in left lower leg: Secondary | ICD-10-CM | POA: Diagnosis not present

## 2019-12-01 NOTE — Progress Notes (Signed)
Office Visit Note   Patient: Julie Hopkins           Date of Birth: 1960/06/27           MRN: [id number] Visit Date: 12/01/2019 Requested by: Orma Flaming, [address] [place],  [place] [address] PCP: Orma Flaming, MD  Subjective: Chief Complaint  Patient presents with  . Left Lower Leg - Pain    Pain in left calf x 2 weeks after a run up a hill 6-7 times. Rested it 3-4 days and tried running again. After 5 minutes, had to stop due to pain. Been applying ice and a compression sleeve - some better.    HPI: She is here with left calf pain.  2 weeks ago she decided to work on full running.  She ran up and down a hill about 6 or 7 times.  She did not feel any pain while doing it, but 2 days later she started pain on the lateral aspect left calf.  It has continued to bother her, she is unable to resume running again.  Minimal pain at rest.  She did not notice any bruising or swelling.  She took meloxicam a couple doses with some improvement.               ROS:   All other systems were reviewed and are negative.  Objective: Vital Signs: There were no vitals taken for this visit.  Physical Exam:  General:  Alert and oriented, in no acute distress. Pulm:  Breathing unlabored. Psy:  Normal mood, congruent affect. Skin: No bruising Left leg: She has normal appearance of her calf compared to the right.  She is able to walk on her heels and on her toes.  She has minimal pain with resisted strength testing.  She has tenderness to palpation of the lateral head of the gastrocnemius.  Imaging: No results found.  Assessment & Plan: 1.  Left calf strain -Physical therapy, meloxicam as needed.  Follow-up as needed.     Procedures: No procedures performed  No notes on file     PMFS History: Patient Active Problem List   Diagnosis Date Noted  . B12 deficiency 09/26/2019  . Hyperlipidemia 06/09/2019  . Genital herpes 06/06/2019  . Depression, recurrent ([place])  04/29/2018  . Hair loss 04/29/2018  . Primary insomnia 04/29/2018  . Seasonal allergic rhinitis 04/29/2018  . Situational anxiety 04/29/2018   Past Medical History:  Diagnosis Date  . Anemia   . Depression   . GERD (gastroesophageal reflux disease)   . Heart murmur   . Post-operative nausea and vomiting     Family History  Problem Relation Age of Onset  . Cancer Mother   . COPD Mother   . Early death Mother   . Heart disease Mother   . Miscarriages / [place] Mother   . Alcohol abuse Father   . Depression Father   . Alcohol abuse Brother   . Early death Brother   . Heart attack Brother   . Hypertension Brother   . Uterine cancer Paternal Grandmother   . Early death Paternal Grandfather   . Depression Daughter   . Alcohol abuse Brother   . Depression Brother   . Heart disease Brother   . Colon cancer Neg Hx   . Pancreatic cancer Neg Hx   . Rectal cancer Neg Hx   . Stomach cancer Neg Hx   . Esophageal cancer Neg Hx   . Colon polyps  Neg Hx     Past Surgical History:  Procedure Laterality Date  . ABDOMINAL HYSTERECTOMY  2005  . COLONOSCOPY  11/08/2018  . GYNECOLOGIC CRYOSURGERY    . MYOMECTOMY    . SALPINGOOPHORECTOMY     both at 2 different surgeries  . UPPER GASTROINTESTINAL ENDOSCOPY  11/08/2018  . WISDOM TOOTH EXTRACTION     Social History   Occupational History  . Occupation: Retired  Tobacco Use  . Smoking status: Former Smoker    Types: Cigarettes    Quit date: 02/07/1998    Years since quitting: 21.8  . Smokeless tobacco: Never Used  Vaping Use  . Vaping Use: Never used  Substance and Sexual Activity  . Alcohol use: Yes    Alcohol/week: 1.0 - 2.0 standard drink    Types: 1 - 2 Glasses of wine per week    Comment: occasionally  . Drug use: Never  . Sexual activity: Yes    Birth control/protection: Post-menopausal

## 2019-12-04 ENCOUNTER — Other Ambulatory Visit: Payer: Self-pay

## 2019-12-04 ENCOUNTER — Ambulatory Visit: Payer: 59 | Admitting: Rehabilitative and Restorative Service Providers"

## 2019-12-04 ENCOUNTER — Encounter: Payer: Self-pay | Admitting: Rehabilitative and Restorative Service Providers"

## 2019-12-04 DIAGNOSIS — M6281 Muscle weakness (generalized): Secondary | ICD-10-CM

## 2019-12-04 DIAGNOSIS — M79662 Pain in left lower leg: Secondary | ICD-10-CM

## 2019-12-04 DIAGNOSIS — R262 Difficulty in walking, not elsewhere classified: Secondary | ICD-10-CM

## 2019-12-04 NOTE — Patient Instructions (Signed)
Access Code: [email or web address] URL: [email or web address] Date: 12/04/2019 Prepared by: Scot Jun  Exercises Standing Gastroc Stretch on Step - 2 x daily - 7 x weekly - 1 sets - 5 reps - 30 sec hold Standing Dorsiflexion Self-Mobilization on Step - 2 x daily - 7 x weekly - 10 reps - 3 sets - 2-3 hold Standing Soleus Stretch on Step - 2 x daily - 7 x weekly - 1 sets - 5 reps - 30 hold Single Leg Stance - 1 x daily - 7 x weekly - 1 sets - 5 reps - 30 hold

## 2019-12-04 NOTE — Therapy (Signed)
[facility] [address] [place], [place], [address] Phone: [phone]   Fax:  [phone]  Physical Therapy Evaluation  Patient Details  Name: Julie Hopkins MRN: [id number] Date of Birth: Aug 22, 1960 Referring Provider (PT): Dr. Junius Roads   Encounter Date: 12/04/2019   PT End of Session - 12/04/19 1432    Visit Number 1    Number of Visits 12    Date for PT Re-Evaluation 01/29/20    PT Start Time 7564    PT Stop Time 1506    PT Time Calculation (min) 35 min    Activity Tolerance Patient tolerated treatment well    Behavior During Therapy [facility] for tasks assessed/performed           Past Medical History:  Diagnosis Date  . Anemia   . Depression   . GERD (gastroesophageal reflux disease)   . Heart murmur   . Post-operative nausea and vomiting     Past Surgical History:  Procedure Laterality Date  . ABDOMINAL HYSTERECTOMY  2005  . COLONOSCOPY  11/08/2018  . GYNECOLOGIC CRYOSURGERY    . MYOMECTOMY    . SALPINGOOPHORECTOMY     both at 2 different surgeries  . UPPER GASTROINTESTINAL ENDOSCOPY  11/08/2018  . WISDOM TOOTH EXTRACTION      There were no vitals filed for this visit.    Subjective Assessment - 12/04/19 1436    Subjective Pt. stated running for about 15 years and last year she had some complaints of hamstring issues and had seen Dr. Junius Roads and had physical therapy to help.  Pt. stated recent return to running this spring.  Pt. stated she was running a few weeks ago and did some hill repeats and two days later had Lt calf/lower leg pain.  Pt. stated trying for about 5 mins that day but couldn't keep going.  Had trouble c walking prolonged since.  Pt. stated some improvement recently but hasn't tried to return to activity at this time.  Has treated with ice and compression.    Limitations Walking    Patient Stated Goals Reduce pain, return to running    Currently in Pain? Yes    Pain Score 0-No pain   at worst 8/10    Pain Location Leg    Pain Orientation Left;Mid;Lower;Lateral    Pain Descriptors / Indicators Aching;Tightness    Pain Type Acute pain    Pain Onset 1 to 4 weeks ago    Pain Frequency Intermittent    Aggravating Factors  walking, standing    Pain Relieving Factors ice, rest    Effect of Pain on Daily Activities Trying to return to run.              [facility] PT Assessment - 12/04/19 0001      Assessment   Medical Diagnosis Lt lower leg pain    Referring Provider (PT) Dr. Junius Roads    Onset Date/Surgical Date 11/06/19    Hand Dominance Right    Prior Therapy Hamstring treatment in therapy to Lt side      Precautions   Precautions None      Restrictions   Weight Bearing Restrictions No      Balance Screen   Has the patient fallen in the past 6 months No    Is the patient reluctant to leave their home because of a fear of falling?  No      Home Ecologist residence    Home Access Stairs  to enter    Entrance Stairs-Number of Steps 2    Entrance Stairs-Rails None      Prior Function   Level of Independence Independent    Leisure Running, walking      Cognition   Overall Cognitive Status Within Functional Limits for tasks assessed      Functional Tests   Functional tests Single leg stance      Single Leg Stance   Comments Rt SLS 10 seconds, Lt 8 seconds      ROM / Strength   AROM / PROM / Strength Strength;PROM;AROM      AROM   AROM Assessment Site Ankle;Knee    Right/Left Ankle Left;Right    Right Ankle Dorsiflexion 7    Left Ankle Dorsiflexion 5    Left Ankle Plantar Flexion 50    Left Ankle Inversion 50      Strength   Strength Assessment Site Ankle    Right/Left Ankle Left;Right    Right Ankle Dorsiflexion 5/5    Right Ankle Plantar Flexion 5/5    Right Ankle Inversion 5/5    Right Ankle Eversion 5/5    Left Ankle Dorsiflexion 5/5    Left Ankle Plantar Flexion 4/5   tightness, difficulty increase   Left Ankle Inversion 5/5      Left Ankle Eversion 5/5      Palpation   Palpation comment TrP noted in medial and lateral heads of Lt gastroc                      Objective measurements completed on examination: See above findings.       [facility] PT Treatment/Exercise - 12/04/19 0001      Exercises   Exercises Other Exercises    Other Exercises  HEP instruction/performance c cues and demonstration, repeat back demonstration.  Ex: gastroc stretch 30 sec x 5 bilateral, soleus stretch 30 sec x 5 bilateral, DF on step 2 x 10 bilateral, SLS 30 sec x 5 bilateral 2x day each      Modalities   Modalities Cryotherapy      Cryotherapy   Number Minutes Cryotherapy 5 Minutes    Cryotherapy Location Other (comment)   Lt calf   Type of Cryotherapy Ice pack      Manual Therapy   Manual therapy comments compression to Lt lateral gastroc                  PT Education - 12/04/19 1433    Education Details HEP, POC, DN    Person(s) Educated Patient    Methods Explanation;Demonstration;Verbal cues;Handout    Comprehension Returned demonstration;Verbalized understanding            PT Short Term Goals - 12/04/19 1510      PT SHORT TERM GOAL #1   Title Patient will demonstrate independent use of home exercise program to maintain progress from in clinic treatments.    Time 2    Period Weeks    Status New    Target Date 12/18/19             PT Long Term Goals - 12/04/19 1431      PT LONG TERM GOAL #1   Title Patient will demonstrate/report pain at worst less than or equal to 2/10 to facilitate minimal limitation in daily activity secondary to pain symptoms.    Time 8    Period Weeks    Status New    Target Date 01/29/20  PT LONG TERM GOAL #2   Title Patient will demonstrate independent use of home exercise program to facilitate ability to maintain/progress functional gains from skilled physical therapy services.    Time 8    Period Weeks    Status New    Target Date 01/29/20       PT LONG TERM GOAL #3   Title Pt. will demonstrate bilateral DF arom > 10 deg to facilitate usual mobility heel to toe for walking/ambulation s restriction.    Time 8    Period Weeks    Status New    Target Date 01/29/20      PT LONG TERM GOAL #4   Title Pt. will demonstrate SLS 30 seconds to faciltiate stability in single leg stance in ambulation/running.    Time 8    Period Weeks    Status New    Target Date 01/29/20      PT LONG TERM GOAL #5   Title Pt. will demonstrate return to running at PLOF.    Time 8    Period Weeks    Status New    Target Date 01/29/20                  Plan - 12/04/19 1429    Clinical Impression Statement Patient is a 59 y.o. female who comes to clinic with complaints of Lt lower leg pain with mobility, strength and movement coordination deficits that impair their ability to perform usual daily and recreational functional activities without increase difficulty/symptoms at this time.  Patient to benefit from skilled PT services to address impairments and limitations to improve to previous level of function without restriction secondary to condition.    Personal Factors and Comorbidities Other   GERD, depression   Examination-Activity Limitations Other   walking, running   Examination-Participation Restrictions Other   running   Stability/Clinical Decision Making Stable/Uncomplicated    Clinical Decision Making Low    Rehab Potential Good    PT Frequency --   1-2x/week   PT Duration 8 weeks    PT Treatment/Interventions ADLs/Self Care Home Management;Electrical Stimulation;Cryotherapy;Iontophoresis 4mg /ml Dexamethasone;Moist Heat;Balance training;Therapeutic exercise;Therapeutic activities;Functional mobility training;Stair training;Gait training;Ultrasound;Neuromuscular re-education;Patient/family education;Manual techniques;Vasopneumatic Device;Taping;Dry needling;Passive range of motion;Spinal Manipulations;Joint Manipulations    PT Next  Visit Plan Reassess HEP, DN prn    PT Home Exercise Plan [email or web address]    Consulted and Agree with Plan of Care Patient           Patient will benefit from skilled therapeutic intervention in order to improve the following deficits and impairments:  Pain, Impaired perceived functional ability, Impaired flexibility, Decreased range of motion, Decreased balance, Decreased mobility, Difficulty walking, Increased muscle spasms, Decreased activity tolerance, Decreased strength, Hypomobility  Visit Diagnosis: Pain in left lower leg  Muscle weakness (generalized)  Difficulty in walking, not elsewhere classified     Problem List Patient Active Problem List   Diagnosis Date Noted  . B12 deficiency 09/26/2019  . Hyperlipidemia 06/09/2019  . Genital herpes 06/06/2019  . Depression, recurrent ([place]) 04/29/2018  . Hair loss 04/29/2018  . Primary insomnia 04/29/2018  . Seasonal allergic rhinitis 04/29/2018  . Situational anxiety 04/29/2018    Scot Jun, PT, DPT, OCS, ATC 12/04/19  3:17 PM    [facility] [address] [place], [place], [address] Phone: [phone]   Fax:  [phone]  Name: Julie Hopkins MRN: [id number] Date of Birth: 01/07/1961

## 2019-12-18 ENCOUNTER — Encounter: Payer: Self-pay | Admitting: Rehabilitative and Restorative Service Providers"

## 2019-12-18 ENCOUNTER — Other Ambulatory Visit: Payer: Self-pay

## 2019-12-18 ENCOUNTER — Ambulatory Visit (INDEPENDENT_AMBULATORY_CARE_PROVIDER_SITE_OTHER): Payer: 59 | Admitting: Rehabilitative and Restorative Service Providers"

## 2019-12-18 DIAGNOSIS — M6281 Muscle weakness (generalized): Secondary | ICD-10-CM | POA: Diagnosis not present

## 2019-12-18 DIAGNOSIS — R262 Difficulty in walking, not elsewhere classified: Secondary | ICD-10-CM | POA: Diagnosis not present

## 2019-12-18 DIAGNOSIS — M79662 Pain in left lower leg: Secondary | ICD-10-CM

## 2019-12-18 NOTE — Therapy (Addendum)
[facility] Physical Therapy [address] [place], [place], [address] Phone: [phone]   Fax:  [phone]  Physical Therapy Treatment/Discharge  Patient Details  Name: Julie Hopkins MRN: [id number] Date of Birth: [birth year] Referring Provider (PT): Dr. Junius Roads   Encounter Date: 12/18/2019   PT End of Session - 12/18/19 0815    Visit Number 2    Number of Visits 12    Date for PT Re-Evaluation 01/29/20    PT Start Time 0804    PT Stop Time 0842    PT Time Calculation (min) 38 min    Activity Tolerance Patient tolerated treatment well    Behavior During Therapy [facility] for tasks assessed/performed           Past Medical History:  Diagnosis Date  . Anemia   . Depression   . GERD (gastroesophageal reflux disease)   . Heart murmur   . Post-operative nausea and vomiting     Past Surgical History:  Procedure Laterality Date  . ABDOMINAL HYSTERECTOMY  2005  . COLONOSCOPY  11/08/2018  . GYNECOLOGIC CRYOSURGERY    . MYOMECTOMY    . SALPINGOOPHORECTOMY     both at 2 different surgeries  . UPPER GASTROINTESTINAL ENDOSCOPY  11/08/2018  . WISDOM TOOTH EXTRACTION      There were no vitals filed for this visit.   Subjective Assessment - 12/18/19 0805    Subjective Pt. indicated feeling improvement.  Pt. stated running a few times with one time of tightness in calf resulting in walking but overall getting progress.    Limitations Walking    Patient Stated Goals Reduce pain, return to running    Currently in Pain? No/denies    Pain Score 0-No pain    Pain Onset 1 to 4 weeks ago                             [facility] Adult PT Treatment/Exercise - 12/18/19 0001      Exercises   Exercises Ankle    Other Exercises  Review of HEP for home use      Modalities   Modalities Moist Heat      Moist Heat Therapy   Number Minutes Moist Heat 5 Minutes    Moist Heat Location Other (comment)   Lt calf c prolonged stretch     Manual Therapy    Manual therapy comments compression to Lt lateral gastroc      Ankle Exercises: Stretches   Soleus Stretch 30 seconds;5 reps   incline board   Gastroc Stretch 30 seconds;5 reps   incline board     Ankle Exercises: Aerobic   Nustep Lvl 6 10 mins            Trigger Point Dry Needling - 12/18/19 0001    Consent Given? Yes    Muscles Treated Lower Quadrant Gastrocnemius    Gastrocnemius Response Twitch response elicited                  PT Short Term Goals - 12/04/19 1510      PT SHORT TERM GOAL #1   Title Patient will demonstrate independent use of home exercise program to maintain progress from in clinic treatments.    Time 2    Period Weeks    Status New    Target Date 12/18/19             PT Long Term Goals - 12/04/19 1431  PT LONG TERM GOAL #1   Title Patient will demonstrate/report pain at worst less than or equal to 2/10 to facilitate minimal limitation in daily activity secondary to pain symptoms.    Time 8    Period Weeks    Status New    Target Date 01/29/20      PT LONG TERM GOAL #2   Title Patient will demonstrate independent use of home exercise program to facilitate ability to maintain/progress functional gains from skilled physical therapy services.    Time 8    Period Weeks    Status New    Target Date 01/29/20      PT LONG TERM GOAL #3   Title Pt. will demonstrate bilateral DF arom > 10 deg to facilitate usual mobility heel to toe for walking/ambulation s restriction.    Time 8    Period Weeks    Status New    Target Date 01/29/20      PT LONG TERM GOAL #4   Title Pt. will demonstrate SLS 30 seconds to faciltiate stability in single leg stance in ambulation/running.    Time 8    Period Weeks    Status New    Target Date 01/29/20      PT LONG TERM GOAL #5   Title Pt. will demonstrate return to running at PLOF.    Time 8    Period Weeks    Status New    Target Date 01/29/20                 Plan - 12/18/19 0819      Clinical Impression Statement Positive improvement in symptoms and function reported at this time.  Pt. to attempt HEP for several weeks, return to clinic prn.    Personal Factors and Comorbidities Other   GERD, depression   Examination-Activity Limitations Other   walking, running   Examination-Participation Restrictions Other   running   Stability/Clinical Decision Making Stable/Uncomplicated    Rehab Potential Good    PT Frequency --   1-2x/week   PT Duration 8 weeks    PT Treatment/Interventions ADLs/Self Care Home Management;Electrical Stimulation;Cryotherapy;Iontophoresis 3m/ml Dexamethasone;Moist Heat;Balance training;Therapeutic exercise;Therapeutic activities;Functional mobility training;Stair training;Gait training;Ultrasound;Neuromuscular re-education;Patient/family education;Manual techniques;Vasopneumatic Device;Taping;Dry needling;Passive range of motion;Spinal Manipulations;Joint Manipulations    PT Next Visit Plan HEP trial, return prn.    PT Home Exercise Plan [email or web address]    Consulted and Agree with Plan of Care Patient           Patient will benefit from skilled therapeutic intervention in order to improve the following deficits and impairments:  Pain, Impaired perceived functional ability, Impaired flexibility, Decreased range of motion, Decreased balance, Decreased mobility, Difficulty walking, Increased muscle spasms, Decreased activity tolerance, Decreased strength, Hypomobility  Visit Diagnosis: Pain in left lower leg  Muscle weakness (generalized)  Difficulty in walking, not elsewhere classified     Problem List Patient Active Problem List   Diagnosis Date Noted  . B12 deficiency 09/26/2019  . Hyperlipidemia 06/09/2019  . Genital herpes 06/06/2019  . Depression, recurrent ([place] 04/29/2018  . Hair loss 04/29/2018  . Primary insomnia 04/29/2018  . Seasonal allergic rhinitis 04/29/2018  . Situational anxiety 04/29/2018    MScot Jun PT, DPT, OCS,  ATC 12/18/19  8:32 AM  PHYSICAL THERAPY DISCHARGE SUMMARY  Visits from Start of Care: 2  Current functional level related to goals / functional outcomes: See note   Remaining deficits: See note   Education / Equipment: HEP Plan: Patient agrees to  discharge.  Patient goals were partially met. Patient is being discharged due to not returning since the last visit.  ?????     Scot Jun, PT, DPT, OCS, ATC 02/10/20  1:57 PM     [facility] [address] [place], [place], [address] Phone: [phone]   Fax:  [phone]  Name: Julie Hopkins MRN: [id number] Date of Birth: 11-17-1960

## 2019-12-23 ENCOUNTER — Encounter: Payer: 59 | Admitting: Rehabilitative and Restorative Service Providers"

## 2019-12-25 ENCOUNTER — Other Ambulatory Visit: Payer: Self-pay

## 2019-12-25 ENCOUNTER — Other Ambulatory Visit (INDEPENDENT_AMBULATORY_CARE_PROVIDER_SITE_OTHER): Payer: 59

## 2019-12-25 DIAGNOSIS — R350 Frequency of micturition: Secondary | ICD-10-CM

## 2019-12-25 LAB — POCT URINALYSIS DIPSTICK
Bilirubin, UA: NEGATIVE
Blood, UA: NEGATIVE
Glucose, UA: NEGATIVE
Ketones, UA: NEGATIVE
Leukocytes, UA: NEGATIVE
Nitrite, UA: NEGATIVE
Protein, UA: NEGATIVE
Spec Grav, UA: 1.01 (ref 1.010–1.025)
Urobilinogen, UA: 0.2 E.U./dL
pH, UA: 6 (ref 5.0–8.0)

## 2019-12-26 LAB — URINE CULTURE
MICRO NUMBER:: 10905159
Result:: NO GROWTH
SPECIMEN QUALITY:: ADEQUATE

## 2019-12-31 ENCOUNTER — Encounter: Payer: 59 | Admitting: Rehabilitative and Restorative Service Providers"

## 2020-01-05 IMAGING — MG DIGITAL SCREENING BILATERAL MAMMOGRAM WITH TOMO AND CAD
8 series · 9 of 24 positions shown · non-contrast
Comparison: Previous exam(s).

CLINICAL DATA: Screening.

EXAM:
DIGITAL SCREENING BILATERAL MAMMOGRAM WITH TOMO AND CAD

[L CC synth-2D]
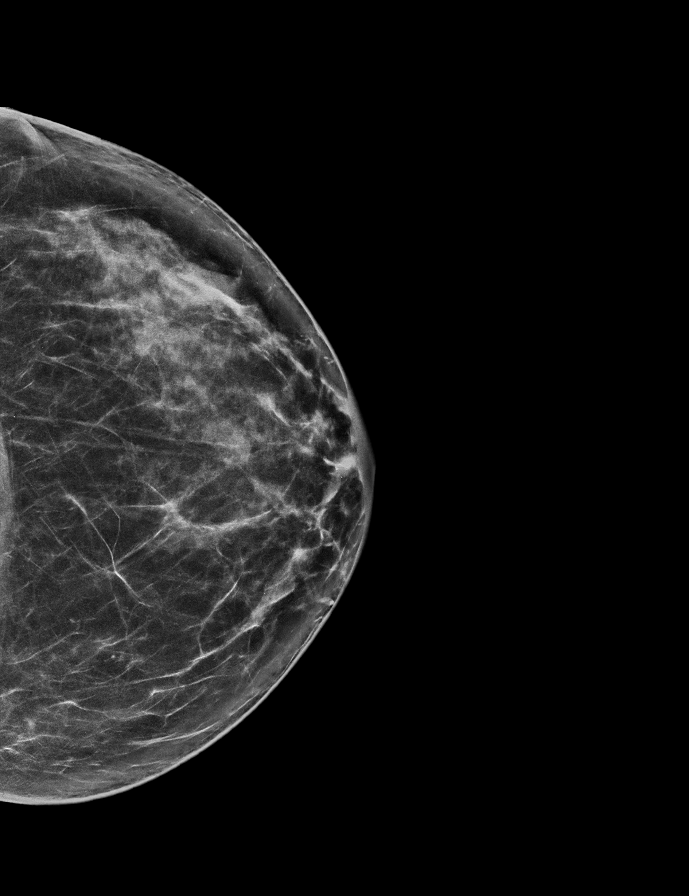

[L MLO synth-2D]
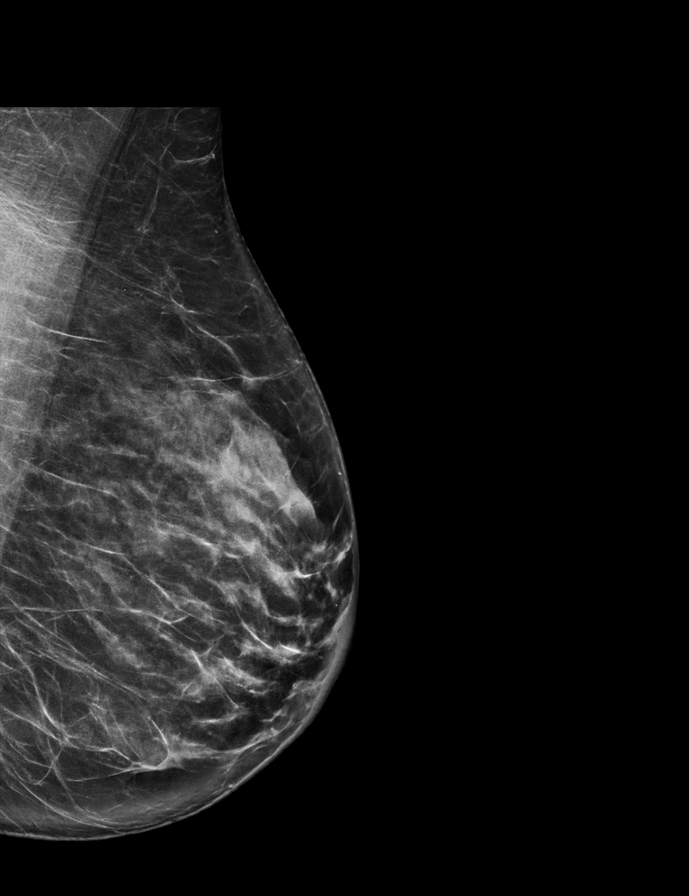

[R MLO synth-2D]
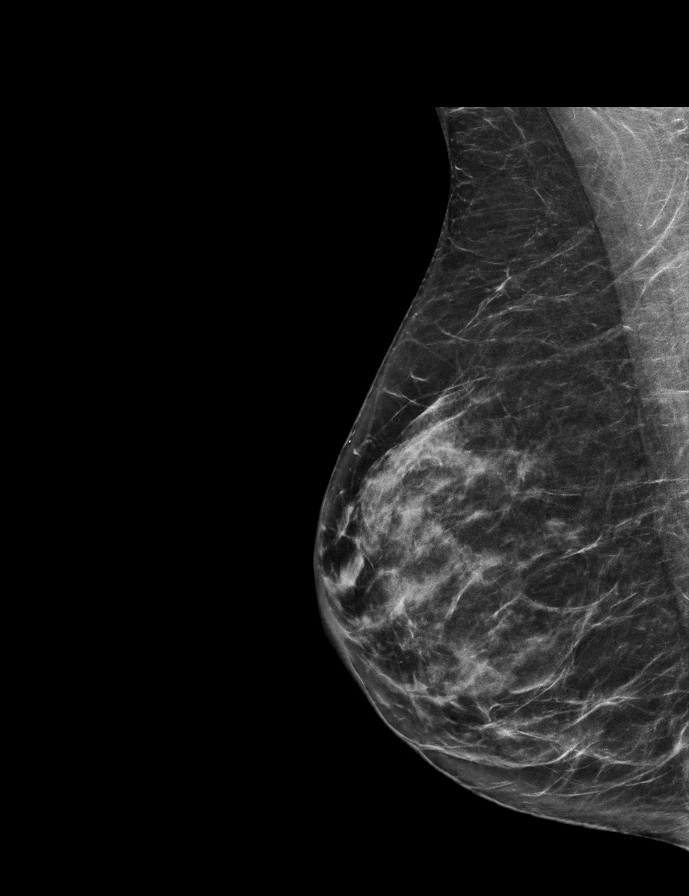

[R CC synth-2D]
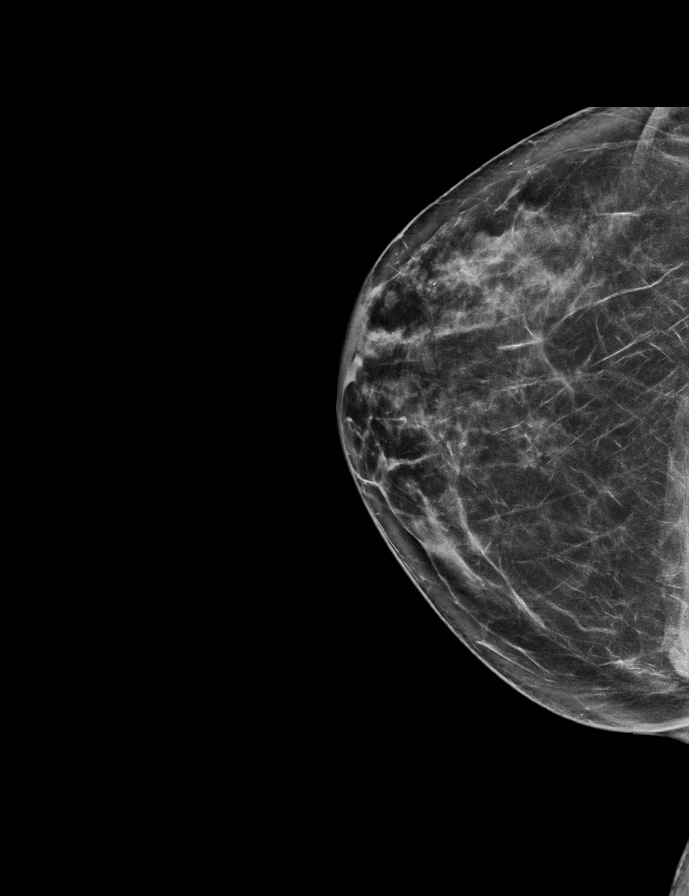

[R CC tomo · 2 of 69 frames shown]
[frame 23/69]
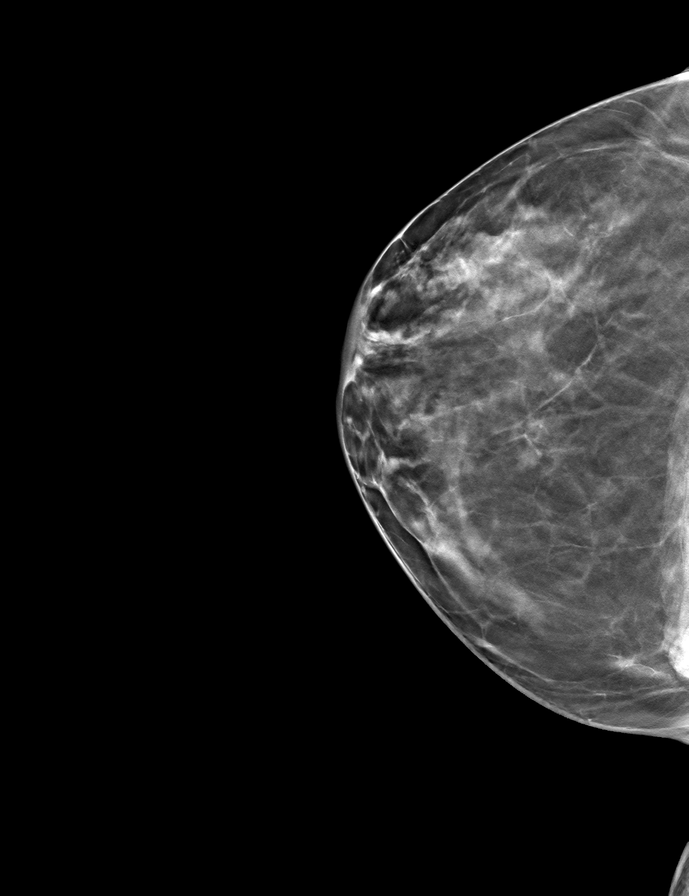
[frame 35/69]
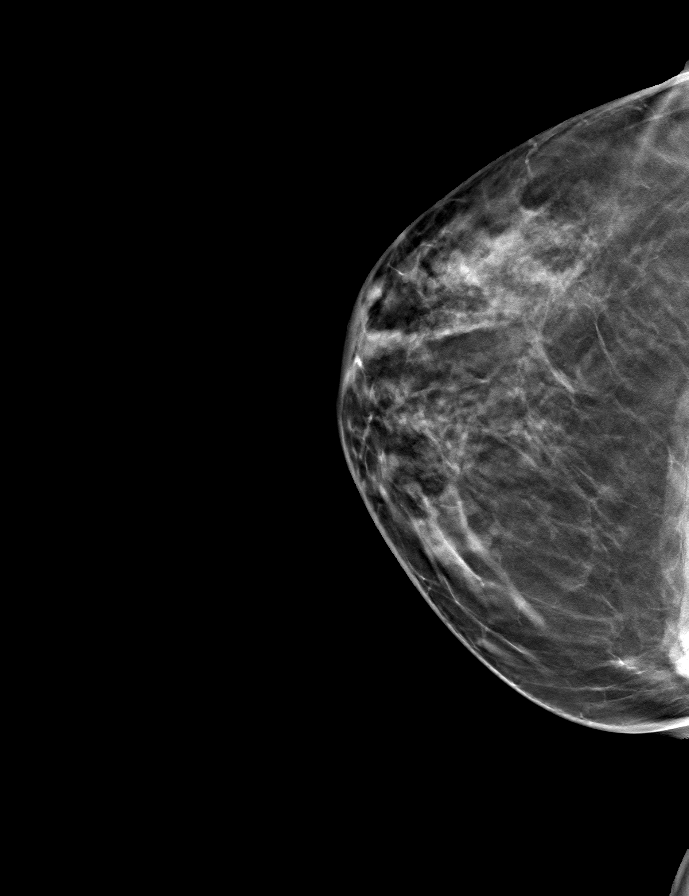

[L CC tomo · tomo slice 33/65.0]
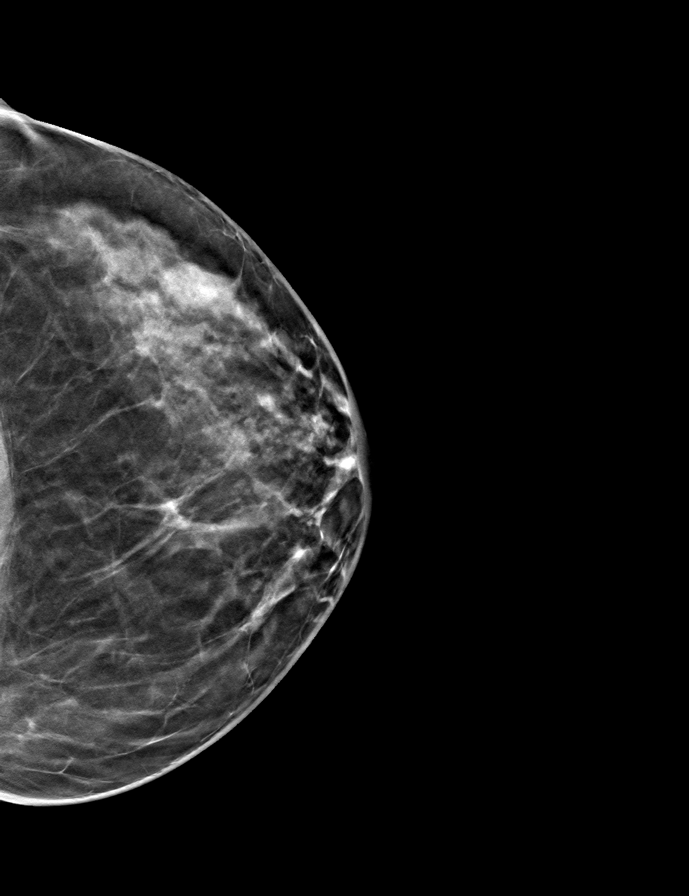

[L MLO tomo · tomo slice 34/67.0]
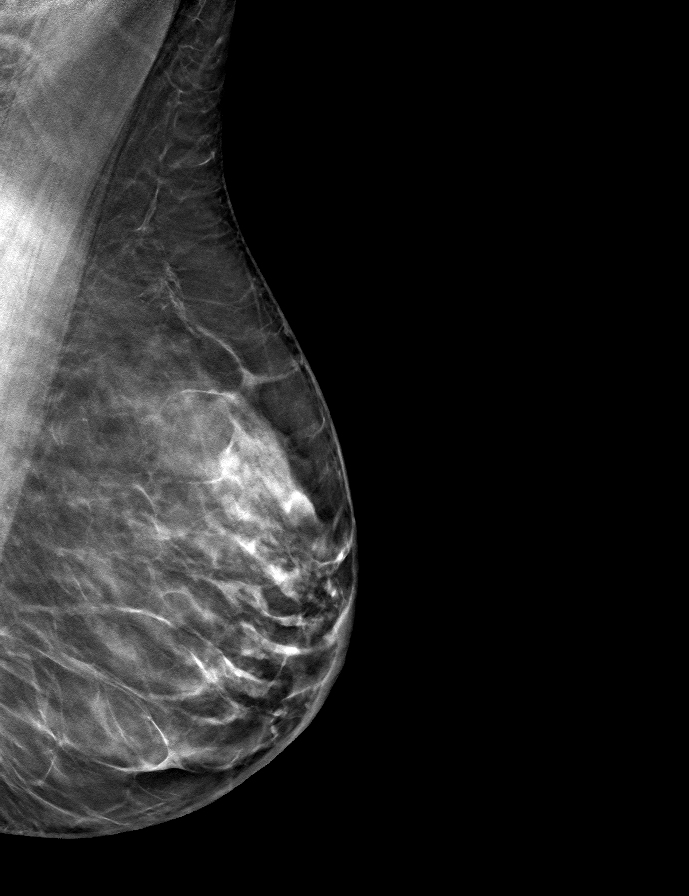

[R MLO tomo · tomo slice 31/62.0]
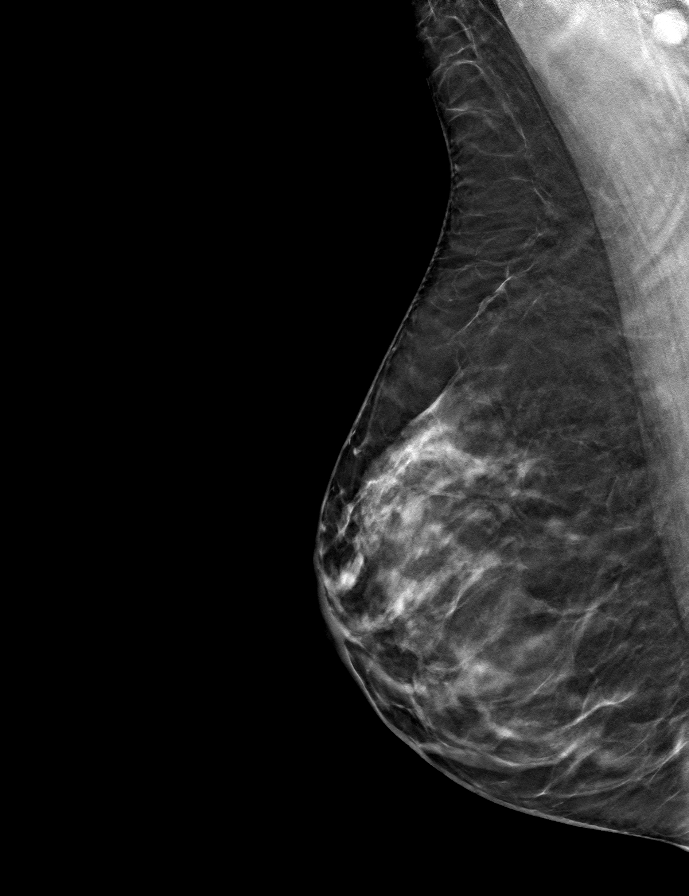

[9 of 24 positions shown; findings below may reference images not displayed]

ACR Breast Density Category c: The breast tissue is heterogeneously
dense, which may obscure small masses.
FINDINGS: There are no findings suspicious for malignancy. Images were
processed with CAD.
IMPRESSION: No mammographic evidence of malignancy. A result letter of this
screening mammogram will be mailed directly to the patient.

RECOMMENDATION:
Screening mammogram in one year. (Code:[id number])

BI-RADS CATEGORY  1: Negative.

## 2020-01-27 ENCOUNTER — Ambulatory Visit: Payer: 59 | Attending: Internal Medicine

## 2020-01-27 DIAGNOSIS — Z23 Encounter for immunization: Secondary | ICD-10-CM

## 2020-01-27 NOTE — Progress Notes (Signed)
   Covid-19 Vaccination Clinic  Name:  Ashaunti Treptow    MRN: [id number] DOB: [birth year]  01/27/2020  Ms. Hatton was observed post Covid-19 immunization for 15 minutes without incident. She was provided with Vaccine Information Sheet and instruction to access the V-Safe system.   Ms. Timoney was instructed to call 911 with any severe reactions post vaccine: Marland Kitchen Difficulty breathing  . Swelling of face and throat  . A fast heartbeat  . A bad rash all over body  . Dizziness and weakness

## 2020-02-17 ENCOUNTER — Other Ambulatory Visit: Payer: Self-pay | Admitting: Family Medicine

## 2020-02-17 DIAGNOSIS — F339 Major depressive disorder, recurrent, unspecified: Secondary | ICD-10-CM

## 2020-04-19 ENCOUNTER — Telehealth: Payer: Self-pay | Admitting: *Deleted

## 2020-04-19 NOTE — Telephone Encounter (Signed)
Spoke with patient.  Refill request received for Valtrex 500 mg tab.  Patient reports she has 5 days left of medication.  Last AEX 03/04/19.  AEX scheduled for 04/21/20 at 8am with Dr. Talbert Nan, patient will wait for AEX to further discuss refill.  Questions answered, patient thankful for call.  Encounter closed.

## 2020-04-20 NOTE — Progress Notes (Signed)
59 y.o. G59P1011 Married White or Caucasian Not Hispanic or Latino female here for annual exam.   H/O hysterectomy, on oral ERT. Tried transdermal estrogen, couldn't sleep, didn't feel like herself, vasomotor symptoms.  Willing to try a lower dose. Sexually active, no pain.   H/O mixed incontinence. Tried PT here wasn't helpful, was more helpful in [place] (they did electric therapy via a chair). Not really leaking, but has some urgency.   H/O genital hsv. She is on daily valtrex.     No LMP recorded. Patient has had a hysterectomy.          Sexually active: Yes.    The current method of family planning is post menopausal status.    Exercising: Yes.    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 Her smoking use included cigarettes. She has never used smokeless tobacco. She reports current alcohol use of about 1.0 - 2.0 standard drink of alcohol per week. She reports that she does not use drugs. Retired. Daughter just graduated from [employer], [place].  Currently working, hopes to go to med school in 1.5 years.   Past Medical History:  Diagnosis Date   Anemia    Depression    GERD (gastroesophageal reflux disease)    Heart murmur    Post-operative nausea and vomiting     Past Surgical History:  Procedure Laterality Date   ABDOMINAL HYSTERECTOMY  2005   COLONOSCOPY  11/08/2018   GYNECOLOGIC CRYOSURGERY     MYOMECTOMY     SALPINGOOPHORECTOMY     both at 2 different surgeries   UPPER GASTROINTESTINAL ENDOSCOPY  11/08/2018   WISDOM TOOTH EXTRACTION      Current Outpatient Medications  Medication Sig Dispense Refill   buPROPion (WELLBUTRIN XL) 300 MG 24 hr tablet TAKE ONE TABLET BY MOUTH  DAILY 90 tablet 1   Cholecalciferol (VITAMIN D3) 125 MCG (5000 UT) CAPS Take by mouth daily.     estradiol (ESTRACE) 1 MG tablet Take 1 tablet (1 mg total) by mouth daily. 90 tablet 3   ibuprofen (ADVIL) 600 MG tablet Take by mouth.     loratadine (CLARITIN) 10 MG tablet Take 10 mg by mouth daily.     meloxicam (MOBIC) 15 MG tablet Take 0.5-1 tablets (7.5-15 mg total) by mouth daily as needed for pain. 30 tablet 6   Multiple Vitamin (MULTIVITAMIN) tablet Take 1 tablet by mouth daily.     mupirocin ointment (BACTROBAN) 2 % Apply 1 application topically 3 (three) times daily. 30 g 0   pantoprazole (PROTONIX) 40 MG tablet Take by mouth.     valACYclovir (VALTREX) 500 MG tablet Take one twice daily at onset of outbreak. Once daily for suppression 90 tablet 2   vitamin B-12 (CYANOCOBALAMIN) 500 MCG tablet Take by mouth.     No current facility-administered medications for this visit.    Family History  Problem Relation Age of Onset   Cancer Mother    COPD Mother    Early death Mother    Heart disease Mother    63 / [place] Mother    Alcohol abuse Father    Depression Father  Alcohol abuse Brother    Early death Brother    Heart attack Brother    Hypertension Brother    Uterine cancer Paternal Grandmother    Early death Paternal Grandfather    Depression Daughter    Alcohol abuse Brother    Depression Brother    Heart disease Brother    Colon cancer Neg Hx    Pancreatic cancer Neg Hx    Rectal cancer Neg Hx    Stomach cancer Neg Hx    Esophageal cancer Neg Hx    Colon polyps Neg Hx     Review of Systems  All other systems reviewed and are negative.   Exam:   BP 122/64    Pulse 68    Ht 5' 7.5" (1.715 m)    Wt 175 lb 3.2 oz (79.5 kg)    SpO2 100%    BMI 27.04 kg/m   Weight change: @WEIGHTCHANGE @ Height:   Height: 5' 7.5" (171.5 cm)  Ht Readings from Last 3 Encounters:  04/21/20 5' 7.5" (1.715 m)  09/25/19 5\' 7"  (1.702 m)   09/03/19 5\' 7"  (1.702 m)    General appearance: alert, cooperative and appears stated age Head: Normocephalic, without obvious abnormality, atraumatic Neck: no adenopathy, supple, symmetrical, trachea midline and thyroid normal to inspection and palpation Lungs: clear to auscultation bilaterally Cardiovascular: regular rate and rhythm Breasts: normal appearance, no masses or tenderness Abdomen: soft, non-tender; non distended,  no masses,  no organomegaly Extremities: extremities normal, atraumatic, no cyanosis or edema Skin: Skin color, texture, turgor normal. No rashes or lesions Lymph nodes: Cervical, supraclavicular, and axillary nodes normal. No abnormal inguinal nodes palpated Neurologic: Grossly normal   Pelvic: External genitalia:  no lesions              Urethra:  normal appearing urethra with no masses, tenderness or lesions              Bartholins and Skenes: normal                 Vagina: normal appearing vagina with normal color and discharge, no lesions              Cervix: absent               Bimanual Exam:  Uterus:  uterus absent              Adnexa: no mass, fullness, tenderness               Rectovaginal: Confirms               Anus:  normal sphincter tone, no lesions  chaperoned for the exam.  A:  Well Woman with normal exam  Distant h/o cervical dysplasia, h/o hysterectomy  On ERT, didn't like transdermal   H/O HSV  P:   No pap this year  Mammogram and colonoscopy UTD  Labs with primary  Willing to try a lower dose of ERT, changed to 0.5 mg of estrace daily  Daily Valtrex  Discussed breast self exam  Discussed calcium and vit D intake

## 2020-04-21 ENCOUNTER — Other Ambulatory Visit: Payer: Self-pay

## 2020-04-21 ENCOUNTER — Encounter: Payer: Self-pay | Admitting: Obstetrics and Gynecology

## 2020-04-21 ENCOUNTER — Ambulatory Visit: Payer: 59 | Admitting: Obstetrics and Gynecology

## 2020-04-21 VITALS — BP 122/64 | HR 68 | Ht 67.5 in | Wt 175.2 lb

## 2020-04-21 DIAGNOSIS — Z79890 Hormone replacement therapy (postmenopausal): Secondary | ICD-10-CM

## 2020-04-21 DIAGNOSIS — Z01419 Encounter for gynecological examination (general) (routine) without abnormal findings: Secondary | ICD-10-CM | POA: Diagnosis not present

## 2020-04-21 DIAGNOSIS — B009 Herpesviral infection, unspecified: Secondary | ICD-10-CM | POA: Diagnosis not present

## 2020-04-21 DIAGNOSIS — Z5181 Encounter for therapeutic drug level monitoring: Secondary | ICD-10-CM | POA: Diagnosis not present

## 2020-04-21 MED ORDER — VALACYCLOVIR HCL 500 MG PO TABS: ORAL_TABLET | ORAL | 3 refills | Status: DC

## 2020-04-21 MED ORDER — ESTRADIOL 0.5 MG PO TABS
0.5000 mg | ORAL_TABLET | Freq: Every day | ORAL | 3 refills | Status: DC
Start: 2020-04-21 — End: 2021-04-19

## 2020-04-21 NOTE — Patient Instructions (Signed)

## 2020-05-16 ENCOUNTER — Other Ambulatory Visit: Payer: Self-pay | Admitting: Obstetrics and Gynecology

## 2020-07-23 ENCOUNTER — Telehealth: Payer: Self-pay

## 2020-07-23 DIAGNOSIS — Z8249 Family history of ischemic heart disease and other diseases of the circulatory system: Secondary | ICD-10-CM

## 2020-07-23 NOTE — Telephone Encounter (Signed)
Pt called asking if Dr. Rogers Blocker would place a referral for cardiology. Please advise.

## 2020-07-25 NOTE — Telephone Encounter (Signed)
That is fine, but I just need to know why for coding purposes... did something happen?  Dr. Rogers Blocker

## 2020-07-26 NOTE — Telephone Encounter (Signed)
Pt says that she has a family hx of Heart disease. She normal sees the Cardiologist annually, but since moving 2 years ago she hasn't. She also states that she is a runner, and the last couple of times she ran, she felt unusually out of breath with some weird back pain. She is requesting Dr. Marlou Porch because he is in network with [facility].

## 2020-08-14 ENCOUNTER — Other Ambulatory Visit: Payer: Self-pay | Admitting: Family Medicine

## 2020-08-14 DIAGNOSIS — F339 Major depressive disorder, recurrent, unspecified: Secondary | ICD-10-CM

## 2020-09-21 ENCOUNTER — Other Ambulatory Visit: Payer: Self-pay

## 2020-09-21 ENCOUNTER — Encounter: Payer: Self-pay | Admitting: Cardiology

## 2020-09-21 ENCOUNTER — Ambulatory Visit (INDEPENDENT_AMBULATORY_CARE_PROVIDER_SITE_OTHER): Payer: 59 | Admitting: Cardiology

## 2020-09-21 VITALS — BP 112/70 | HR 51 | Ht 67.5 in | Wt 164.0 lb

## 2020-09-21 DIAGNOSIS — Z01818 Encounter for other preprocedural examination: Secondary | ICD-10-CM

## 2020-09-21 DIAGNOSIS — Z8249 Family history of ischemic heart disease and other diseases of the circulatory system: Secondary | ICD-10-CM

## 2020-09-21 DIAGNOSIS — Z01812 Encounter for preprocedural laboratory examination: Secondary | ICD-10-CM

## 2020-09-21 DIAGNOSIS — R079 Chest pain, unspecified: Secondary | ICD-10-CM

## 2020-09-21 DIAGNOSIS — E7800 Pure hypercholesterolemia, unspecified: Secondary | ICD-10-CM | POA: Diagnosis not present

## 2020-09-21 DIAGNOSIS — R0602 Shortness of breath: Secondary | ICD-10-CM

## 2020-09-21 DIAGNOSIS — Z87891 Personal history of nicotine dependence: Secondary | ICD-10-CM

## 2020-09-21 NOTE — Patient Instructions (Addendum)
Medication Instructions:  The current medical regimen is effective;  continue present plan and medications.  *If you need a refill on your cardiac medications before your next appointment, please call your pharmacy*  You may need lab work before your CT scan once it has been scheduled.  That blood work will be completed here at this office.  Testing/Procedures: Your physician has requested that you have an echocardiogram. Echocardiography is a painless test that uses sound waves to create images of your heart. It provides your doctor with information about the size and shape of your heart and how well your heart's chambers and valves are working. This procedure takes approximately one hour. There are no restrictions for this procedure.  Your cardiac CT will be scheduled at:   Please arrive at the [facility] main entrance (entrance A) of [facility] 30 minutes prior to test start time. Proceed to the [facility] Radiology Department (first floor) to check-in and test prep.  Please follow these instructions carefully (unless otherwise directed):  On the Night Before the Test: . Be sure to Drink plenty of water. . Do not consume any caffeinated/decaffeinated beverages or chocolate 12 hours prior to your test. . Do not take any antihistamines 12 hours prior to your test.  On the Day of the Test: . Drink plenty of water until 1 hour prior to the test. . Do not eat any food 4 hours prior to the test. . You may take your regular medications prior to the test.  . Take metoprolol (Lopressor) two hours prior to test. . HOLD Furosemide/Hydrochlorothiazide morning of the test. . FEMALES- please wear underwire-free bra if available    After the Test: . Drink plenty of water. . After receiving IV contrast, you may experience a mild flushed feeling. This is normal. . On occasion, you may experience a mild rash up to 24 hours after the test. This is not dangerous. If this occurs, you can  take Benadryl 25 mg and increase your fluid intake. . If you experience trouble breathing, this can be serious. If it is severe call 911 IMMEDIATELY. If it is mild, please call our office. . If you take any of these medications: Glipizide/Metformin, Avandament, Glucavance, please do not take 48 hours after completing test unless otherwise instructed.   Once we have confirmed authorization from your insurance company, we will call you to set up a date and time for your test. Based on how quickly your insurance processes prior authorizations requests, please allow up to 4 weeks to be contacted for scheduling your Cardiac CT appointment. Be advised that routine Cardiac CT appointments could be scheduled as many as 8 weeks after your provider has ordered it.  For non-scheduling related questions, please contact the cardiac imaging nurse navigator should you have any questions/concerns: Marchia Bond, Cardiac Imaging Nurse Navigator Gordy Clement, Cardiac Imaging Nurse Navigator [facility] Direct Office Dial: [phone]   For scheduling needs, including cancellations and rescheduling, please call [place], [phone].  Follow-Up: At [facility], you and your health needs are our priority.  As part of our continuing mission to provide you with exceptional heart care, we have created designated Provider Care Teams.  These Care Teams include your primary Cardiologist (physician) and Advanced Practice Providers (APPs -  Physician Assistants and Nurse Practitioners) who all work together to provide you with the care you need, when you need it.  We recommend signing up for the patient portal called "MyChart".  Sign up information is provided on this After Visit Summary.  MyChart is used to connect with patients for Virtual Visits (Telemedicine).  Patients are able to view lab/test results, encounter notes, upcoming appointments, etc.  Non-urgent messages can be sent to your  provider as well.   To learn more about what you can do with MyChart, go to [email or web address].    Your next appointment:   12 month(s)  The format for your next appointment:   In Person  Provider:   Candee Furbish, MD   Thank you for choosing [facility]!!

## 2020-09-21 NOTE — Progress Notes (Signed)
Cardiology Office Note:    Date:  09/21/2020   ID:  Julie, Hopkins 1961/01/12, MRN [id number]  PCP:  Orma Flaming, MD   [facility] Providers Cardiologist:  Candee Furbish, MD     Referring MD: Orma Flaming, MD     History of Present Illness:    Julie Hopkins is a 60 y.o. female for evaluation of early CAD at the request of Dr. Orma Flaming.  Overall she has noticed some decrease in exercise tolerance.  Used to be and still is an avid runner.  She noticed quite a decrease in overall endurance after stopping for a little while.  She also had some episodes of chest discomfort transiently.  Shortness of breath seem to be more pronounced.  Her husband, Julie Hopkins, is a patient of mine.  Had bypass surgery.  Unfortunately recently was diagnosed with pancreatic cancer.  They went on vacation, had a clot in his leg.  Subsequent diagnosis was made.  Denies any bleeding fevers chills nausea vomiting syncope.  LDL 146, creatinine 0.79 ALT 13 hemoglobin 13.3    Past Medical History:  Diagnosis Date  . Anemia   . Depression   . GERD (gastroesophageal reflux disease)   . Heart murmur   . Post-operative nausea and vomiting     Past Surgical History:  Procedure Laterality Date  . ABDOMINAL HYSTERECTOMY  2005  . COLONOSCOPY  11/08/2018  . GYNECOLOGIC CRYOSURGERY    . MYOMECTOMY    . SALPINGOOPHORECTOMY     both at 2 different surgeries  . UPPER GASTROINTESTINAL ENDOSCOPY  11/08/2018  . WISDOM TOOTH EXTRACTION      Current Medications: Current Meds  Medication Sig  . buPROPion (WELLBUTRIN XL) 300 MG 24 hr tablet TAKE ONE TABLET BY MOUTH DAILY  . Cholecalciferol (VITAMIN D3) 125 MCG (5000 UT) CAPS Take by mouth daily.  Marland Kitchen estradiol (ESTRACE) 0.5 MG tablet Take 1 tablet (0.5 mg total) by mouth daily.  Marland Kitchen ibuprofen (ADVIL) 600 MG tablet Take by mouth.  . loratadine (CLARITIN) 10 MG tablet Take 10 mg by mouth daily.  . meloxicam (MOBIC) 15 MG tablet Take 0.5-1  tablets (7.5-15 mg total) by mouth daily as needed for pain.  . Multiple Vitamin (MULTIVITAMIN) tablet Take 1 tablet by mouth daily.  . mupirocin ointment (BACTROBAN) 2 % Apply 1 application topically 3 (three) times daily.  . pantoprazole (PROTONIX) 40 MG tablet Take by mouth.  . valACYclovir (VALTREX) 500 MG tablet Take one twice daily at onset of outbreak. Once daily for suppression  . vitamin B-12 (CYANOCOBALAMIN) 500 MCG tablet Take by mouth.     Allergies:   Morphine sulfate and Sulfa antibiotics   Social History   Socioeconomic History  . Marital status: Married    Spouse name: Jewel Mcafee   . Number of children: 1  . Years of education: Not on file  . Highest education level: Not on file  Occupational History  . Occupation: Retired  Tobacco Use  . Smoking status: Former Smoker    Types: Cigarettes    Quit date: 02/07/1998    Years since quitting: 22.6  . Smokeless tobacco: Never Used  Vaping Use  . Vaping Use: Never used  Substance and Sexual Activity  . Alcohol use: Yes    Alcohol/week: 1.0 - 2.0 standard drink    Types: 1 - 2 Glasses of wine per week    Comment: occasionally  . Drug use: Never  . Sexual activity: Yes    Birth  control/protection: Post-menopausal  Other Topics Concern  . Not on file  Social History Narrative  . Not on file   Social Determinants of Health   Financial Resource Strain: Not on file  Food Insecurity: Not on file  Transportation Needs: Not on file  Physical Activity: Not on file  Stress: Not on file  Social Connections: Not on file     Family History: The patient's family history includes Alcohol abuse in her brother, brother, and father; COPD in her mother; Cancer in her mother; Depression in her brother, daughter, and father; Early death in her brother, mother, and paternal grandfather; Heart attack in her brother; Heart disease in her brother and mother; Hypertension in her brother; Miscarriages / [place] in her mother;  Uterine cancer in her paternal grandmother. There is no history of Colon cancer, Pancreatic cancer, Rectal cancer, Stomach cancer, Esophageal cancer, or Colon polyps.  ROS:   Please see the history of present illness. All other systems reviewed and are negative.  EKGs/Labs/Other Studies Reviewed:    The following studies were reviewed today:   EKG:   09/21/20-EKG is sinus bradycardia 51 no other specific abnormality ordered today.   Recent Labs: 09/25/2019: ALT 13; BUN 20; Creatinine, Ser 0.79; Hemoglobin 13.3; Platelets 227.0; Potassium 4.2; Sodium 137; TSH 0.80  Recent Lipid Panel    Component Value Date/Time   CHOL 250 (H) 09/25/2019 0908   TRIG 72.0 09/25/2019 0908   HDL 89.20 09/25/2019 0908   CHOLHDL 3 09/25/2019 0908   VLDL 14.4 09/25/2019 0908   LDLCALC 146 (H) 09/25/2019 0908     Risk Assessment/Calculations:      Physical Exam:    VS:  BP 112/70 (BP Location: Left Arm, Patient Position: Sitting, Cuff Size: Normal)   Pulse (!) 51   Ht 5' 7.5" (1.715 m)   Wt 164 lb (74.4 kg)   SpO2 98%   BMI 25.31 kg/m     Wt Readings from Last 3 Encounters:  09/21/20 164 lb (74.4 kg)  04/21/20 175 lb 3.2 oz (79.5 kg)  09/25/19 171 lb 3.2 oz (77.7 kg)     GEN: Well nourished, well developed in no acute distress HEENT: Normal NECK: No JVD; No carotid bruits LYMPHATICS: No lymphadenopathy CARDIAC: RRR, no murmurs, rubs, gallops RESPIRATORY:  Clear to auscultation without rales, wheezing or rhonchi  ABDOMEN: Soft, non-tender, non-distended MUSCULOSKELETAL:  No edema; No deformity  SKIN: Warm and dry NEUROLOGIC:  Alert and oriented x 3 PSYCHIATRIC:  Normal affect   ASSESSMENT:    1. Chest pain of uncertain etiology   2. Family history of early CAD   3. Pure hypercholesterolemia   4. Former smoker   5. Pre-procedure lab exam   6. Shortness of breath    PLAN:    In order of problems listed above:  Family history of heart disease --Brother MI died 41, other CHF,  mother also CHF. No issues now. A few months ago running had chest pain, sweaty. Running at times been doing it for 15 years. Tried to run regularly. Better but still SOB. Endurance down.  --Had stress test and cath 15 years ago and 5 years ago during high stress, palpitations.  Chest discomfort shortness of breath - Decreased endurance.  We will go ahead and check a coronary CT scan with possible FFR analysis for further evaluation.  Excellent heart rate of 51 bpm.  It has been over 5 years since her last coronary evaluation. - I will also check an echocardiogram given  her shortness of breath.  Former smoker --60yrs old to 8.  Has not smoked since.  Hyperlipidemia - LDL 146.  If she does have evidence of coronary calcification, would strongly consider statin therapy for prevention.   Medication Adjustments/Labs and Tests Ordered: Current medicines are reviewed at length with the patient today.  Concerns regarding medicines are outlined above.  Orders Placed This Encounter  Procedures  . CT CORONARY MORPH W/CTA COR W/SCORE W/CA W/CM &/OR WO/CM  . Basic metabolic panel  . EKG 12-Lead  . ECHOCARDIOGRAM COMPLETE   No orders of the defined types were placed in this encounter.   Patient Instructions  Medication Instructions:  The current medical regimen is effective;  continue present plan and medications.  *If you need a refill on your cardiac medications before your next appointment, please call your pharmacy*  You may need lab work before your CT scan once it has been scheduled.  That blood work will be completed here at this office.  Testing/Procedures: Your physician has requested that you have an echocardiogram. Echocardiography is a painless test that uses sound waves to create images of your heart. It provides your doctor with information about the size and shape of your heart and how well your heart's chambers and valves are working. This procedure takes approximately one  hour. There are no restrictions for this procedure.  Your cardiac CT will be scheduled at:   Please arrive at the [facility] main entrance (entrance A) of [facility] 30 minutes prior to test start time. Proceed to the [facility] Radiology Department (first floor) to check-in and test prep.  Please follow these instructions carefully (unless otherwise directed):  On the Night Before the Test: . Be sure to Drink plenty of water. . Do not consume any caffeinated/decaffeinated beverages or chocolate 12 hours prior to your test. . Do not take any antihistamines 12 hours prior to your test.  On the Day of the Test: . Drink plenty of water until 1 hour prior to the test. . Do not eat any food 4 hours prior to the test. . You may take your regular medications prior to the test.  . Take metoprolol (Lopressor) two hours prior to test. . HOLD Furosemide/Hydrochlorothiazide morning of the test. . FEMALES- please wear underwire-free bra if available    After the Test: . Drink plenty of water. . After receiving IV contrast, you may experience a mild flushed feeling. This is normal. . On occasion, you may experience a mild rash up to 24 hours after the test. This is not dangerous. If this occurs, you can take Benadryl 25 mg and increase your fluid intake. . If you experience trouble breathing, this can be serious. If it is severe call 911 IMMEDIATELY. If it is mild, please call our office. . If you take any of these medications: Glipizide/Metformin, Avandament, Glucavance, please do not take 48 hours after completing test unless otherwise instructed.   Once we have confirmed authorization from your insurance company, we will call you to set up a date and time for your test. Based on how quickly your insurance processes prior authorizations requests, please allow up to 4 weeks to be contacted for scheduling your Cardiac CT appointment. Be advised that routine Cardiac CT appointments could be  scheduled as many as 8 weeks after your provider has ordered it.  For non-scheduling related questions, please contact the cardiac imaging nurse navigator should you have any questions/concerns: Marchia Bond, Cardiac Imaging Nurse Navigator  Gordy Clement, Cardiac Imaging Nurse Navigator [facility] Direct Office Dial: [phone]   For scheduling needs, including cancellations and rescheduling, please call [place], [phone].  Follow-Up: At [facility], you and your health needs are our priority.  As part of our continuing mission to provide you with exceptional heart care, we have created designated Provider Care Teams.  These Care Teams include your primary Cardiologist (physician) and Advanced Practice Providers (APPs -  Physician Assistants and Nurse Practitioners) who all work together to provide you with the care you need, when you need it.  We recommend signing up for the patient portal called "MyChart".  Sign up information is provided on this After Visit Summary.  MyChart is used to connect with patients for Virtual Visits (Telemedicine).  Patients are able to view lab/test results, encounter notes, upcoming appointments, etc.  Non-urgent messages can be sent to your provider as well.   To learn more about what you can do with MyChart, go to [email or web address].    Your next appointment:   12 month(s)  The format for your next appointment:   In Person  Provider:   Candee Furbish, MD   Thank you for choosing [facility]!!        Signed, Candee Furbish, MD  09/21/2020 2:56 PM    [place]

## 2020-09-23 ENCOUNTER — Other Ambulatory Visit: Payer: Self-pay | Admitting: Obstetrics and Gynecology

## 2020-09-23 DIAGNOSIS — Z1231 Encounter for screening mammogram for malignant neoplasm of breast: Secondary | ICD-10-CM

## 2020-09-24 ENCOUNTER — Other Ambulatory Visit: Payer: Self-pay | Admitting: Obstetrics and Gynecology

## 2020-09-24 DIAGNOSIS — Z1231 Encounter for screening mammogram for malignant neoplasm of breast: Secondary | ICD-10-CM

## 2020-10-18 ENCOUNTER — Other Ambulatory Visit: Payer: Self-pay

## 2020-10-18 ENCOUNTER — Ambulatory Visit (HOSPITAL_COMMUNITY): Payer: 59 | Attending: Cardiovascular Disease

## 2020-10-18 ENCOUNTER — Other Ambulatory Visit: Payer: 59 | Admitting: *Deleted

## 2020-10-18 DIAGNOSIS — R079 Chest pain, unspecified: Secondary | ICD-10-CM | POA: Diagnosis not present

## 2020-10-18 DIAGNOSIS — Z8249 Family history of ischemic heart disease and other diseases of the circulatory system: Secondary | ICD-10-CM

## 2020-10-18 DIAGNOSIS — Z01812 Encounter for preprocedural laboratory examination: Secondary | ICD-10-CM

## 2020-10-18 DIAGNOSIS — R0602 Shortness of breath: Secondary | ICD-10-CM | POA: Diagnosis present

## 2020-10-18 LAB — BASIC METABOLIC PANEL
BUN/Creatinine Ratio: 16 (ref 12–28)
BUN: 12 mg/dL (ref 8–27)
CO2: 25 mmol/L (ref 20–29)
Calcium: 9.1 mg/dL (ref 8.7–10.3)
Chloride: 104 mmol/L (ref 96–106)
Creatinine, Ser: 0.76 mg/dL (ref 0.57–1.00)
Glucose: 85 mg/dL (ref 65–99)
Potassium: 4.2 mmol/L (ref 3.5–5.2)
Sodium: 140 mmol/L (ref 134–144)
eGFR: 89 mL/min/{1.73_m2} (ref >59)

## 2020-10-18 LAB — ECHOCARDIOGRAM COMPLETE
Area-P 1/2: 4.24 cm2
S' Lateral: 3.5 cm

## 2020-11-02 ENCOUNTER — Telehealth (HOSPITAL_COMMUNITY): Payer: Self-pay | Admitting: Emergency Medicine

## 2020-11-02 NOTE — Telephone Encounter (Signed)
Attempted to call patient regarding upcoming cardiac CT appointment. °Left message on voicemail with name and callback number °Ariele Vidrio RN Navigator Cardiac Imaging °[place] [facility] °[phone] Office °[phone] Cell ° °

## 2020-11-02 NOTE — Telephone Encounter (Signed)
Pt calling requesting to postpone CCTA due to her husbands illness requiring her to stay with him. We pushed her appt out 2 wks. Will check back closer to appt to review CCTA instructions.  Marchia Bond RN Navigator Cardiac Imaging [facility] [phone] Office  [phone] Cell

## 2020-11-04 ENCOUNTER — Ambulatory Visit (HOSPITAL_COMMUNITY): Admission: RE | Admit: 2020-11-04 | Payer: 59 | Source: Ambulatory Visit

## 2020-11-18 ENCOUNTER — Ambulatory Visit
Admission: RE | Admit: 2020-11-18 | Discharge: 2020-11-18 | Disposition: A | Discharge status: Home / Self Care | Payer: 59 | Source: Ambulatory Visit

## 2020-11-18 ENCOUNTER — Other Ambulatory Visit: Payer: Self-pay

## 2020-11-18 DIAGNOSIS — Z1231 Encounter for screening mammogram for malignant neoplasm of breast: Secondary | ICD-10-CM

## 2020-11-19 ENCOUNTER — Telehealth (HOSPITAL_COMMUNITY): Payer: Self-pay | Admitting: Emergency Medicine

## 2020-11-19 NOTE — Telephone Encounter (Signed)
Reaching out to patient to offer assistance regarding upcoming cardiac imaging study; pt verbalizes understanding of appt date/time, parking situation and where to check in, pre-test NPO status and medications ordered, and verified current allergies; name and call back number provided for further questions should they arise Marchia Bond RN Navigator Cardiac Imaging [facility] [phone] office [phone] cell  Denies claustro  Denies iv issues No pre-meds, resting rate 51

## 2020-11-22 ENCOUNTER — Other Ambulatory Visit: Payer: Self-pay

## 2020-11-22 ENCOUNTER — Ambulatory Visit (HOSPITAL_COMMUNITY)
Admission: RE | Admit: 2020-11-22 | Discharge: 2020-11-22 | Disposition: A | Discharge status: Home / Self Care | Payer: 59 | Source: Ambulatory Visit | Attending: Cardiology | Admitting: Cardiology

## 2020-11-22 DIAGNOSIS — Z8249 Family history of ischemic heart disease and other diseases of the circulatory system: Secondary | ICD-10-CM

## 2020-11-22 DIAGNOSIS — R079 Chest pain, unspecified: Secondary | ICD-10-CM

## 2020-11-22 DIAGNOSIS — R0602 Shortness of breath: Secondary | ICD-10-CM

## 2020-11-22 MED ORDER — NITROGLYCERIN 0.4 MG SL SUBL
0.8000 mg | SUBLINGUAL_TABLET | Freq: Once | SUBLINGUAL | Status: AC
  Administered 2020-11-22: 0.8 mg via SUBLINGUAL

## 2020-11-22 MED ORDER — NITROGLYCERIN 0.4 MG SL SUBL
SUBLINGUAL_TABLET | SUBLINGUAL | Status: AC
  Filled 2020-11-22: qty 2

## 2020-11-22 MED ORDER — IOHEXOL 350 MG/ML SOLN
100.0000 mL | Freq: Once | INTRAVENOUS | Status: AC | PRN
  Administered 2020-11-22: 120 mL via INTRAVENOUS

## 2020-11-23 ENCOUNTER — Telehealth: Payer: Self-pay

## 2020-11-23 DIAGNOSIS — E7800 Pure hypercholesterolemia, unspecified: Secondary | ICD-10-CM

## 2020-11-23 MED ORDER — ROSUVASTATIN CALCIUM 10 MG PO TABS: 10.0000 mg | ORAL_TABLET | Freq: Every day | ORAL | 3 refills | Status: DC

## 2020-11-23 NOTE — Telephone Encounter (Signed)
-----   Message from Jerline Pain, MD sent at 11/22/2020  8:52 PM EDT ----- Given mild amount of coronary calcification, let's try lower dose Crestor '10mg'$  PO QD. Check lipids in 3 months with ALT.  Candee Furbish, MD

## 2020-11-23 NOTE — Telephone Encounter (Signed)
Discussed CT results and recommendations. We discussed reasons why starting a statin is important based off her results and risk factors. She has agreed to come in for follow up labs as well. No additional questions.

## 2021-01-24 ENCOUNTER — Other Ambulatory Visit: Payer: Self-pay | Admitting: Obstetrics and Gynecology

## 2021-01-24 DIAGNOSIS — B009 Herpesviral infection, unspecified: Secondary | ICD-10-CM

## 2021-01-24 NOTE — Telephone Encounter (Signed)
Annual exam scheduled on 04/2021

## 2021-02-18 ENCOUNTER — Other Ambulatory Visit: Payer: Self-pay | Admitting: Family Medicine

## 2021-02-18 DIAGNOSIS — F339 Major depressive disorder, recurrent, unspecified: Secondary | ICD-10-CM

## 2021-02-22 ENCOUNTER — Other Ambulatory Visit: Payer: 59

## 2021-02-25 ENCOUNTER — Encounter: Payer: 59 | Admitting: Physician Assistant

## 2021-03-01 ENCOUNTER — Other Ambulatory Visit: Payer: 59

## 2021-03-09 ENCOUNTER — Other Ambulatory Visit: Payer: Self-pay | Admitting: Obstetrics and Gynecology

## 2021-03-09 DIAGNOSIS — B009 Herpesviral infection, unspecified: Secondary | ICD-10-CM

## 2021-03-09 NOTE — Telephone Encounter (Signed)
Annual exam due at end of 03/2021-04/2021. Rx for 90tabs 0RF. Tg

## 2021-03-21 ENCOUNTER — Encounter: Payer: 59 | Admitting: Emergency Medicine

## 2021-03-25 ENCOUNTER — Other Ambulatory Visit: Payer: Self-pay | Admitting: Family Medicine

## 2021-03-25 DIAGNOSIS — F339 Major depressive disorder, recurrent, unspecified: Secondary | ICD-10-CM

## 2021-03-25 NOTE — Telephone Encounter (Signed)
I will refill. She does not need to see me if she establishing at another office.

## 2021-03-25 NOTE — Telephone Encounter (Signed)
Patient has appt for TOC to [facility] on 05/12/21.    I have scheduled patient for med refill on 12/5 with Dr. Jerline Pain.  If this appt is not needed for refill please advise.

## 2021-03-28 ENCOUNTER — Telehealth: Payer: 59 | Admitting: Family Medicine

## 2021-03-28 ENCOUNTER — Ambulatory Visit: Payer: 59 | Admitting: Family Medicine

## 2021-04-17 ENCOUNTER — Other Ambulatory Visit: Payer: Self-pay | Admitting: Obstetrics and Gynecology

## 2021-04-19 NOTE — Telephone Encounter (Signed)
Annual exam scheduled on 05/12/21 Last annual exam was 04/21/20 Last mammogram 10/2020

## 2021-05-10 NOTE — Progress Notes (Signed)
61 y.o. G36P1011 Married White or Caucasian Not Hispanic or Latino female here for annual exam.  H/o hysterectomy, on oral ERT (didn't tolerate the patch).  Her husband died in 24-Nov-2022 of pancreatic cancer (diagnosed in 5/22). Married for 23 years. She has a good support system.   Patient states she had a stabbing pain in her vagina and a different smell. Patient stats that those things have subsided now. She would like to be evaluated for a vaginitis.   H/O mixed incontinence. Recently with an increase in urinary urgency and frequency. Voiding small amounts, no dysuria.   H/O genital hsv, on daily valtrex. No outbreaks, willing to change to prn use.     No LMP recorded. Patient has had a hysterectomy.          Sexually active: No.  The current method of family planning is status post hysterectomy.    Exercising: Yes.     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  TDaP:  10/01/2018 Gardasil: n/a   reports that she quit smoking about 23 years ago. Her smoking use included cigarettes. She has never used smokeless tobacco. She reports current alcohol use of about 1.0 - 2.0 standard drink per week. She reports that she does not use drugs. Retired. Daughter is working in [place], plans to go to med school.   Past Medical History:  Diagnosis Date   Anemia    Depression    GERD (gastroesophageal reflux disease)    Heart murmur    Post-operative nausea and vomiting     Past Surgical History:  Procedure Laterality Date   ABDOMINAL HYSTERECTOMY  2005   COLONOSCOPY  11/08/2018   GYNECOLOGIC CRYOSURGERY     MYOMECTOMY     SALPINGOOPHORECTOMY     both at 2 different surgeries   UPPER GASTROINTESTINAL ENDOSCOPY  11/08/2018   WISDOM TOOTH EXTRACTION      Current Outpatient  Medications  Medication Sig Dispense Refill   buPROPion (WELLBUTRIN XL) 300 MG 24 hr tablet TAKE ONE TABLET BY MOUTH DAILY 90 tablet 1   Cholecalciferol (VITAMIN D3) 125 MCG (5000 UT) CAPS Take by mouth daily.     estradiol (ESTRACE) 0.5 MG tablet TAKE ONE TABLET BY MOUTH DAILY 90 tablet 0   ibuprofen (ADVIL) 600 MG tablet Take by mouth.     loratadine (CLARITIN) 10 MG tablet Take 10 mg by mouth daily.     meloxicam (MOBIC) 15 MG tablet Take 0.5-1 tablets (7.5-15 mg total) by mouth daily as needed for pain. 30 tablet 6   Multiple Vitamin (MULTIVITAMIN) tablet Take 1 tablet by mouth daily.     mupirocin ointment (BACTROBAN) 2 % Apply 1 application topically 3 (three) times daily. 30 g 0   pantoprazole (PROTONIX) 40 MG tablet Take by mouth.     valACYclovir (VALTREX) 500 MG tablet TAKE ONE TABLET BY MOUTH TWICE A DAY for 3 days prn OUTBREAK, AND ONCE DAILY FOR SUPPRESSION 90 tablet 0   vitamin B-12 (CYANOCOBALAMIN) 500 MCG tablet Take by mouth.     No current facility-administered medications for this visit.    Family History  Problem Relation Age of Onset   Cancer Mother    COPD Mother    Early  death Mother    Heart disease Mother    Miscarriages / [place] Mother    Alcohol abuse Father    Depression Father    Alcohol abuse Brother    Early death Brother    Heart attack Brother    Hypertension Brother    Uterine cancer Paternal Grandmother    Early death Paternal Grandfather    Depression Daughter    Alcohol abuse Brother    Depression Brother    Heart disease Brother    Colon cancer Neg Hx    Pancreatic cancer Neg Hx    Rectal cancer Neg Hx    Stomach cancer Neg Hx    Esophageal cancer Neg Hx    Colon polyps Neg Hx     Review of Systems  Genitourinary:  Positive for vaginal pain.   Exam:   BP 134/72    Pulse 62    Ht 5\' 6"  (1.676 m)    Wt 166 lb (75.3 kg)    SpO2 98%    BMI 26.79 kg/m   Weight change: @WEIGHTCHANGE @ Height:   Height: 5\' 6"  (167.6 cm)  Ht Readings  from Last 3 Encounters:  05/12/21 5\' 6"  (1.676 m)  09/21/20 5' 7.5" (1.715 m)  04/21/20 5' 7.5" (1.715 m)    General appearance: alert, cooperative and appears stated age Head: Normocephalic, without obvious abnormality, atraumatic Neck: no adenopathy, supple, symmetrical, trachea midline and thyroid normal to inspection and palpation Lungs: clear to auscultation bilaterally Cardiovascular: regular rate and rhythm Breasts: normal appearance, no masses or tenderness Abdomen: soft, non-tender; non distended,  no masses,  no organomegaly Extremities: extremities normal, atraumatic, no cyanosis or edema Skin: Skin color, texture, turgor normal. No rashes or lesions Lymph nodes: Cervical, supraclavicular, and axillary nodes normal. No abnormal inguinal nodes palpated Neurologic: Grossly normal   Pelvic: External genitalia:  no lesions              Urethra:  normal appearing urethra with no masses, tenderness or lesions              Bartholins and Skenes: normal                 Vagina: normal appearing vagina with normal color and discharge, no lesions. Well estrogenized.               Cervix: absent               Bimanual Exam:  Uterus:  uterus absent              Adnexa: no mass, fullness, tenderness               Rectovaginal: Confirms               Anus:  normal sphincter tone, no lesions  Gae Dry chaperoned for the exam.   1. Well woman exam Discussed breast self exam Discussed calcium and vit D intake Mammogram due in 7/22 No pap this year Colonoscopy UTD  2. Encounter for monitoring postmenopausal estrogen replacement therapy Discussed risks of ERT. She is willing to try the patch again. Will discuss the risks with her primary.  - estradiol (VIVELLE-DOT) 0.025 MG/24HR; Place 1 patch onto the skin 2 (two) times a week.  Dispense: 24 patch; Refill: 3  3. HSV-2 (herpes simplex virus 2) infection Will change to prn use. - valACYclovir (VALTREX) 500 MG tablet; TAKE ONE  TABLET BY MOUTH TWICE A DAY for 3 days prn OUTBREAK  Dispense: 30 tablet; Refill: 2  4. Urinary urgency - Urinalysis,Complete w/RFL Culture  5. Vaginal odor Normal exam - WET PREP FOR TRICH, YEAST, CLUE: negative

## 2021-05-12 ENCOUNTER — Other Ambulatory Visit: Payer: Self-pay | Admitting: Emergency Medicine

## 2021-05-12 ENCOUNTER — Encounter: Payer: Self-pay | Admitting: Emergency Medicine

## 2021-05-12 ENCOUNTER — Ambulatory Visit (INDEPENDENT_AMBULATORY_CARE_PROVIDER_SITE_OTHER): Payer: Managed Care, Other (non HMO) | Admitting: Obstetrics and Gynecology

## 2021-05-12 ENCOUNTER — Encounter: Payer: Self-pay | Admitting: Obstetrics and Gynecology

## 2021-05-12 ENCOUNTER — Other Ambulatory Visit: Payer: Self-pay

## 2021-05-12 ENCOUNTER — Ambulatory Visit: Payer: Managed Care, Other (non HMO) | Admitting: Emergency Medicine

## 2021-05-12 VITALS — BP 134/72 | HR 62 | Ht 66.0 in | Wt 166.0 lb

## 2021-05-12 VITALS — BP 112/60 | HR 60 | Ht 66.0 in | Wt 164.0 lb

## 2021-05-12 DIAGNOSIS — E785 Hyperlipidemia, unspecified: Secondary | ICD-10-CM

## 2021-05-12 DIAGNOSIS — Z13228 Encounter for screening for other metabolic disorders: Secondary | ICD-10-CM | POA: Diagnosis not present

## 2021-05-12 DIAGNOSIS — F339 Major depressive disorder, recurrent, unspecified: Secondary | ICD-10-CM | POA: Diagnosis not present

## 2021-05-12 DIAGNOSIS — Z79890 Hormone replacement therapy (postmenopausal): Secondary | ICD-10-CM

## 2021-05-12 DIAGNOSIS — Z0000 Encounter for general adult medical examination without abnormal findings: Secondary | ICD-10-CM | POA: Diagnosis not present

## 2021-05-12 DIAGNOSIS — Z01419 Encounter for gynecological examination (general) (routine) without abnormal findings: Secondary | ICD-10-CM | POA: Diagnosis not present

## 2021-05-12 DIAGNOSIS — Z13220 Encounter for screening for lipoid disorders: Secondary | ICD-10-CM

## 2021-05-12 DIAGNOSIS — B009 Herpesviral infection, unspecified: Secondary | ICD-10-CM

## 2021-05-12 DIAGNOSIS — N898 Other specified noninflammatory disorders of vagina: Secondary | ICD-10-CM

## 2021-05-12 DIAGNOSIS — Z5181 Encounter for therapeutic drug level monitoring: Secondary | ICD-10-CM | POA: Diagnosis not present

## 2021-05-12 DIAGNOSIS — Z1329 Encounter for screening for other suspected endocrine disorder: Secondary | ICD-10-CM | POA: Diagnosis not present

## 2021-05-12 DIAGNOSIS — Z130 Encounter for screening for diseases of the blood and blood-forming organs and certain disorders involving the immune mechanism: Secondary | ICD-10-CM

## 2021-05-12 DIAGNOSIS — R3915 Urgency of urination: Secondary | ICD-10-CM

## 2021-05-12 LAB — COMPREHENSIVE METABOLIC PANEL
ALT: 16 U/L (ref 0–35)
AST: 21 U/L (ref 0–37)
Albumin: 4.3 g/dL (ref 3.5–5.2)
Alkaline Phosphatase: 49 U/L (ref 39–117)
BUN: 12 mg/dL (ref 6–23)
CO2: 30 mEq/L (ref 19–32)
Calcium: 9.4 mg/dL (ref 8.4–10.5)
Chloride: 104 mEq/L (ref 96–112)
Creatinine, Ser: 0.8 mg/dL (ref 0.40–1.20)
GFR: 79.08 mL/min (ref >60.00)
Glucose, Bld: 81 mg/dL (ref 70–99)
Potassium: 4 mEq/L (ref 3.5–5.1)
Sodium: 140 mEq/L (ref 135–145)
Total Bilirubin: 0.4 mg/dL (ref 0.2–1.2)
Total Protein: 6.7 g/dL (ref 6.0–8.3)

## 2021-05-12 LAB — CBC WITH DIFFERENTIAL/PLATELET
Basophils Absolute: 0 10*3/uL (ref 0.0–0.1)
Basophils Relative: 0.5 % (ref 0.0–3.0)
Eosinophils Absolute: 0 10*3/uL (ref 0.0–0.7)
Eosinophils Relative: 0.6 % (ref 0.0–5.0)
HCT: 38.1 % (ref 36.0–46.0)
Hemoglobin: 12.7 g/dL (ref 12.0–15.0)
Lymphocytes Relative: 29.7 % (ref 12.0–46.0)
Lymphs Abs: 1.4 10*3/uL (ref 0.7–4.0)
MCHC: 33.2 g/dL (ref 30.0–36.0)
MCV: 99.1 fl (ref 78.0–100.0)
Monocytes Absolute: 0.3 10*3/uL (ref 0.1–1.0)
Monocytes Relative: 7.6 % (ref 3.0–12.0)
Neutro Abs: 2.8 10*3/uL (ref 1.4–7.7)
Neutrophils Relative %: 61.6 % (ref 43.0–77.0)
Platelets: 229 10*3/uL (ref 150.0–400.0)
RBC: 3.84 Mil/uL — ABNORMAL LOW (ref 3.87–5.11)
RDW: 13.7 % (ref 11.5–15.5)
WBC: 4.6 10*3/uL (ref 4.0–10.5)

## 2021-05-12 LAB — URINALYSIS, COMPLETE W/RFL CULTURE
Bacteria, UA: NONE SEEN /HPF
Bilirubin Urine: NEGATIVE
Casts: NONE SEEN /LPF
Crystals: NONE SEEN /HPF
Glucose, UA: NEGATIVE
Hyaline Cast: NONE SEEN /LPF
Ketones, ur: NEGATIVE
Leukocyte Esterase: NEGATIVE
Nitrites, Initial: NEGATIVE
Protein, ur: NEGATIVE
RBC / HPF: NONE SEEN /HPF (ref 0–2)
Specific Gravity, Urine: 1.003 (ref 1.001–1.035)
WBC, UA: NONE SEEN /HPF (ref 0–5)
Yeast: NONE SEEN /HPF
pH: 5.5 (ref 5.0–8.0)

## 2021-05-12 LAB — LIPID PANEL
Cholesterol: 229 mg/dL — ABNORMAL HIGH (ref 0–200)
HDL: 78.3 mg/dL (ref >39.00)
LDL Cholesterol: 136 mg/dL — ABNORMAL HIGH (ref 0–99)
NonHDL: 151.13
Total CHOL/HDL Ratio: 3
Triglycerides: 75 mg/dL (ref 0.0–149.0)
VLDL: 15 mg/dL (ref 0.0–40.0)

## 2021-05-12 LAB — TSH: TSH: 0.78 u[IU]/mL (ref 0.35–5.50)

## 2021-05-12 LAB — HEMOGLOBIN A1C: Hgb A1c MFr Bld: 5.3 % (ref 4.6–6.5)

## 2021-05-12 LAB — WET PREP FOR TRICH, YEAST, CLUE

## 2021-05-12 MED ORDER — BUPROPION HCL ER (XL) 300 MG PO TB24: 300.0000 mg | ORAL_TABLET | Freq: Every day | ORAL | 3 refills | Status: DC

## 2021-05-12 MED ORDER — ESTRADIOL 0.5 MG PO TABS: 0.5000 mg | ORAL_TABLET | Freq: Every day | ORAL | 3 refills | Status: DC

## 2021-05-12 MED ORDER — ESTRADIOL 0.025 MG/24HR TD PTTW: 1.0000 | MEDICATED_PATCH | TRANSDERMAL | 3 refills | Status: DC

## 2021-05-12 MED ORDER — VALACYCLOVIR HCL 500 MG PO TABS: ORAL_TABLET | ORAL | 2 refills | Status: DC

## 2021-05-12 MED ORDER — ROSUVASTATIN CALCIUM 10 MG PO TABS: 10.0000 mg | ORAL_TABLET | Freq: Every day | ORAL | 3 refills | Status: DC

## 2021-05-12 NOTE — Patient Instructions (Signed)

## 2021-05-12 NOTE — Patient Instructions (Addendum)
You can try replens vaginal moisturizer if you notice an odor  EXERCISE   We recommended that you start or continue a regular exercise program for good health. Physical activity is anything that gets your body moving, some is better than none. The CDC recommends 150 minutes per week of Moderate-Intensity Aerobic Activity and 2 or more days of Muscle Strengthening Activity.  Benefits of exercise are limitless: helps weight loss/weight maintenance, improves mood and energy, helps with depression and anxiety, improves sleep, tones and strengthens muscles, improves balance, improves bone density, protects from chronic conditions such as heart disease, high blood pressure and diabetes and so much more. To learn more visit: WhyNotPoker.uy  DIET: Good nutrition starts with a healthy diet of fruits, vegetables, whole grains, and lean protein sources. Drink plenty of water for hydration. Minimize empty calories, sodium, sweets. For more information about dietary recommendations visit: GeekRegister.com.ee and [email or web address]-mitchell.com/  ALCOHOL:  Women should limit their alcohol intake to no more than 7 drinks/beers/glasses of wine (combined, not each!) per week. Moderation of alcohol intake to this level decreases your risk of breast cancer and liver damage.  If you are concerned that you may have a problem, or your friends have told you they are concerned about your drinking, there are many resources to help. A well-known program that is free, effective, and available to all people all over the nation is Alcoholics Anonymous.  Check out this site to learn more: BlockTaxes.se   CALCIUM AND VITAMIN D:  Adequate intake of calcium and Vitamin D are recommended for bone health.  You should be getting between 1000-1200 mg of calcium and 800 units of Vitamin D daily between diet and supplements  PAP SMEARS:  Pap smears, to  check for cervical cancer or precancers,  have traditionally been done yearly, scientific advances have shown that most women can have pap smears less often.  However, every woman still should have a physical exam from her gynecologist every year. It will include a breast check, inspection of the vulva and vagina to check for abnormal growths or skin changes, a visual exam of the cervix, and then an exam to evaluate the size and shape of the uterus and ovaries. We will also provide age appropriate advice regarding health maintenance, like when you should have certain vaccines, screening for sexually transmitted diseases, bone density testing, colonoscopy, mammograms, etc.   MAMMOGRAMS:  All women over 32 years old should have a routine mammogram.   COLON CANCER SCREENING: Now recommend starting at age 79. At this time colonoscopy is not covered for routine screening until 50. There are take home tests that can be done between 45-49.   COLONOSCOPY:  Colonoscopy to screen for colon cancer is recommended for all women at age 23.  We know, you hate the idea of the prep.  We agree, BUT, having colon cancer and not knowing it is worse!!  Colon cancer so often starts as a polyp that can be seen and removed at colonscopy, which can quite literally save your life!  And if your first colonoscopy is normal and you have no family history of colon cancer, most women don't have to have it again for 10 years.  Once every ten years, you can do something that may end up saving your life, right?  We will be happy to help you get it scheduled when you are ready.  Be sure to check your insurance coverage so you understand how much it will cost.  It may be  covered as a preventative service at no cost, but you should check your particular policy.      Breast Self-Awareness Breast self-awareness means being familiar with how your breasts look and feel. It involves checking your breasts regularly and reporting any changes to  your health care provider. Practicing breast self-awareness is important. A change in your breasts can be a sign of a serious medical problem. Being familiar with how your breasts look and feel allows you to find any problems early, when treatment is more likely to be successful. All women should practice breast self-awareness, including women who have had breast implants. How to do a breast self-exam One way to learn what is normal for your breasts and whether your breasts are changing is to do a breast self-exam. To do a breast self-exam: Look for Changes  Remove all the clothing above your waist. Stand in front of a mirror in a room with good lighting. Put your hands on your hips. Push your hands firmly downward. Compare your breasts in the mirror. Look for differences between them (asymmetry), such as: Differences in shape. Differences in size. Puckers, dips, and bumps in one breast and not the other. Look at each breast for changes in your skin, such as: Redness. Scaly areas. Look for changes in your nipples, such as: Discharge. Bleeding. Dimpling. Redness. A change in position. Feel for Changes Carefully feel your breasts for lumps and changes. It is best to do this while lying on your back on the floor and again while sitting or standing in the shower or tub with soapy water on your skin. Feel each breast in the following way: Place the arm on the side of the breast you are examining above your head. Feel your breast with the other hand. Start in the nipple area and make  inch (2 cm) overlapping circles to feel your breast. Use the pads of your three middle fingers to do this. Apply light pressure, then medium pressure, then firm pressure. The light pressure will allow you to feel the tissue closest to the skin. The medium pressure will allow you to feel the tissue that is a little deeper. The firm pressure will allow you to feel the tissue close to the ribs. Continue the  overlapping circles, moving downward over the breast until you feel your ribs below your breast. Move one finger-width toward the center of the body. Continue to use the  inch (2 cm) overlapping circles to feel your breast as you move slowly up toward your collarbone. Continue the up and down exam using all three pressures until you reach your armpit.  Write Down What You Find  Write down what is normal for each breast and any changes that you find. Keep a written record with breast changes or normal findings for each breast. By writing this information down, you do not need to depend only on memory for size, tenderness, or location. Write down where you are in your menstrual cycle, if you are still menstruating. If you are having trouble noticing differences in your breasts, do not get discouraged. With time you will become more familiar with the variations in your breasts and more comfortable with the exam. How often should I examine my breasts? Examine your breasts every month. If you are breastfeeding, the best time to examine your breasts is after a feeding or after using a breast pump. If you menstruate, the best time to examine your breasts is 5-7 days after your period is over.  During your period, your breasts are lumpier, and it may be more difficult to notice changes. When should I see my health care provider? See your health care provider if you notice: A change in shape or size of your breasts or nipples. A change in the skin of your breast or nipples, such as a reddened or scaly area. Unusual discharge from your nipples. A lump or thick area that was not there before. Pain in your breasts. Anything that concerns you.

## 2021-05-12 NOTE — Progress Notes (Signed)
Julie Hopkins [place] 61 y.o.   Chief Complaint  Patient presents with   New Patient (Initial Visit)    Physical, med refill of bupropion. Pt would like to discuss pain in right foot    HISTORY OF PRESENT ILLNESS: This is a 61 y.o. female first visit to this office, here to establish care with me. Needs physical and medication refill. Has history of depression on bupropion. Non-smoker.  Healthy lifestyle.  Frequent runner.  Has some calluses to her right foot that sometimes get painful. Just saw her gynecologist this morning.  Up-to-date with mammogram and colonoscopy. No gynecological concerns. Review of most recent blood work shows abnormal lipid profile.  Not on cholesterol medication. Patient has seen cardiologist in the recent past with normal work-up. The 10-year ASCVD risk score (Arnett DK, et al., 2019) is: 2.8%   Values used to calculate the score:     Age: 52 years     Sex: Female     Is Non-Hispanic African American: No     Diabetic: No     Tobacco smoker: No     Systolic Blood Pressure: 094 mmHg     Is BP treated: No     HDL Cholesterol: 89.2 mg/dL     Total Cholesterol: 250 mg/dL   HPI   Prior to Admission medications   Medication Sig Start Date End Date Taking? Authorizing Provider  buPROPion (WELLBUTRIN XL) 300 MG 24 hr tablet TAKE ONE TABLET BY MOUTH DAILY 03/25/21  Yes Vivi Barrack, MD  Cholecalciferol (VITAMIN D3) 125 MCG (5000 UT) CAPS Take by mouth daily.   Yes [provider]  estradiol (VIVELLE-DOT) 0.025 MG/24HR Place 1 patch onto the skin 2 (two) times a week. 05/12/21  Yes Salvadore Dom, MD  ibuprofen (ADVIL) 600 MG tablet Take by mouth. 06/09/19  Yes [provider]  loratadine (CLARITIN) 10 MG tablet Take 10 mg by mouth daily.   Yes [provider]  meloxicam (MOBIC) 15 MG tablet Take 0.5-1 tablets (7.5-15 mg total) by mouth daily as needed for pain. 04/11/19  Yes Hilts, Legrand Como, MD  Multiple Vitamin  (MULTIVITAMIN) tablet Take 1 tablet by mouth daily.   Yes [provider]  mupirocin ointment (BACTROBAN) 2 % Apply 1 application topically 3 (three) times daily. 09/03/19  Yes Orma Flaming, MD  pantoprazole (PROTONIX) 40 MG tablet Take by mouth. 03/27/19  Yes [provider]  valACYclovir (VALTREX) 500 MG tablet TAKE ONE TABLET BY MOUTH TWICE A DAY for 3 days prn OUTBREAK 05/12/21  Yes Salvadore Dom, MD  vitamin B-12 (CYANOCOBALAMIN) 500 MCG tablet Take by mouth.   Yes [provider]    Allergies  Allergen Reactions   Morphine Sulfate Itching   Sulfa Antibiotics Palpitations    Pt's mother said she was always allergic to it. Never taken med.     Patient Active Problem List   Diagnosis Date Noted   B12 deficiency 09/26/2019   Hyperlipidemia 06/09/2019   Genital herpes 06/06/2019   Depression, recurrent ([place]) 04/29/2018   Primary insomnia 04/29/2018   Seasonal allergic rhinitis 04/29/2018   Situational anxiety 04/29/2018    Past Medical History:  Diagnosis Date   Anemia    Depression    GERD (gastroesophageal reflux disease)    Heart murmur    Post-operative nausea and vomiting     Past Surgical History:  Procedure Laterality Date   ABDOMINAL HYSTERECTOMY  2005   COLONOSCOPY  11/08/2018   GYNECOLOGIC CRYOSURGERY  MYOMECTOMY     SALPINGOOPHORECTOMY     both at 2 different surgeries   UPPER GASTROINTESTINAL ENDOSCOPY  11/08/2018   WISDOM TOOTH EXTRACTION      Social History   Socioeconomic History   Marital status: Married    Spouse name: Arlis [name]    Number of children: 1   Years of education: Not on file   Highest education level: Not on file  Occupational History   Occupation: Retired  Tobacco Use   Smoking status: Former    Types: Cigarettes    Quit date: 02/07/1998    Years since quitting: 23.2   Smokeless tobacco: Never  Vaping Use   Vaping Use: Never used  Substance and Sexual Activity   Alcohol use: Yes     Alcohol/week: 1.0 - 2.0 standard drink    Types: 1 - 2 Glasses of wine per week    Comment: occasionally   Drug use: Never   Sexual activity: Yes    Birth control/protection: Post-menopausal  Other Topics Concern   Not on file  Social History Narrative   Not on file   Social Determinants of Health   Financial Resource Strain: Not on file  Food Insecurity: Not on file  Transportation Needs: Not on file  Physical Activity: Not on file  Stress: Not on file  Social Connections: Not on file  Intimate Partner Violence: Not on file    Family History  Problem Relation Age of Onset   Cancer Mother    COPD Mother    Early death Mother    Heart disease Mother    Miscarriages / [place] Mother    Alcohol abuse Father    Depression Father    Alcohol abuse Brother    Early death Brother    Heart attack Brother    Hypertension Brother    Uterine cancer Paternal Grandmother    Early death Paternal Grandfather    Depression Daughter    Alcohol abuse Brother    Depression Brother    Heart disease Brother    Colon cancer Neg Hx    Pancreatic cancer Neg Hx    Rectal cancer Neg Hx    Stomach cancer Neg Hx    Esophageal cancer Neg Hx    Colon polyps Neg Hx      Review of Systems  Constitutional: Negative.  Negative for chills and fever.  HENT: Negative.  Negative for congestion and sore throat.   Eyes: Negative.   Respiratory: Negative.  Negative for cough and shortness of breath.   Cardiovascular: Negative.  Negative for chest pain and palpitations.  Gastrointestinal: Negative.  Negative for abdominal pain, diarrhea, nausea and vomiting.  Genitourinary: Negative.  Negative for dysuria and hematuria.  Musculoskeletal: Negative.        Right foot pain  Skin: Negative.  Negative for rash.  Neurological:  Negative for dizziness and headaches.  All other systems reviewed and are negative.   Physical Exam Vitals reviewed.  Constitutional:      Appearance: Normal  appearance.  HENT:     Head: Normocephalic.     Right Ear: Tympanic membrane, ear canal and external ear normal.     Left Ear: Tympanic membrane, ear canal and external ear normal.  Eyes:     Extraocular Movements: Extraocular movements intact.     Conjunctiva/sclera: Conjunctivae normal.     Pupils: Pupils are equal, round, and reactive to light.  Cardiovascular:     Rate and Rhythm: Normal rate and regular  rhythm.     Pulses: Normal pulses.     Heart sounds: Normal heart sounds.  Pulmonary:     Effort: Pulmonary effort is normal.     Breath sounds: Normal breath sounds.  Abdominal:     General: Bowel sounds are normal. There is no distension.     Palpations: Abdomen is soft.     Tenderness: There is no abdominal tenderness.  Musculoskeletal:        General: Normal range of motion.     Cervical back: Neck supple. No tenderness.     Comments: Right foot: Calluses on lateral aspect, slightly tender to palpation  Lymphadenopathy:     Cervical: No cervical adenopathy.  Skin:    General: Skin is warm and dry.     Capillary Refill: Capillary refill takes less than 2 seconds.  Neurological:     General: No focal deficit present.     Mental Status: She is alert and oriented to person, place, and time.  Psychiatric:        Mood and Affect: Mood normal.        Behavior: Behavior normal.     ASSESSMENT & PLAN: Problem List Items Addressed This Visit       Other   Depression, recurrent ([place])   Relevant Medications   buPROPion (WELLBUTRIN XL) 300 MG 24 hr tablet   Other Visit Diagnoses     Routine general medical examination at a health care facility    -  Primary   Screening for deficiency anemia       Relevant Orders   CBC with Differential   Screening for lipoid disorders       Relevant Orders   Lipid panel   Screening for endocrine, metabolic and immunity disorder       Relevant Orders   Comprehensive metabolic panel   Hemoglobin A1c   TSH      Modifiable risk  factors discussed with patient. Anticipatory guidance according to age provided. The following topics were also discussed: Social Determinants of Health Smoking.  Non-smoker Diet and nutrition Benefits of exercise Cancer screening and review of most recent mammogram and colonoscopy reports Vaccinations recommendations Cardiovascular risk assessment The 10-year ASCVD risk score (Arnett DK, et al., 2019) is: 2.8%   Values used to calculate the score:     Age: 29 years     Sex: Female     Is Non-Hispanic African American: No     Diabetic: No     Tobacco smoker: No     Systolic Blood Pressure: 850 mmHg     Is BP treated: No     HDL Cholesterol: 89.2 mg/dL     Total Cholesterol: 250 mg/dL Blood work done today including lipid profile Mental health including depression and anxiety Fall and accident prevention  Patient Instructions  Health Maintenance, Female Adopting a healthy lifestyle and getting preventive care are important in promoting health and wellness. Ask your health care provider about: The right schedule for you to have regular tests and exams. Things you can do on your own to prevent diseases and keep yourself healthy. What should I know about diet, weight, and exercise? Eat a healthy diet  Eat a diet that includes plenty of vegetables, fruits, low-fat dairy products, and lean protein. Do not eat a lot of foods that are high in solid fats, added sugars, or sodium. Maintain a healthy weight Body mass index (BMI) is used to identify weight problems. It estimates body fat based on  height and weight. Your health care provider can help determine your BMI and help you achieve or maintain a healthy weight. Get regular exercise Get regular exercise. This is one of the most important things you can do for your health. Most adults should: Exercise for at least 150 minutes each week. The exercise should increase your heart rate and make you sweat (moderate-intensity exercise). Do  strengthening exercises at least twice a week. This is in addition to the moderate-intensity exercise. Spend less time sitting. Even light physical activity can be beneficial. Watch cholesterol and blood lipids Have your blood tested for lipids and cholesterol at 63 years of age, then have this test every 5 years. Have your cholesterol levels checked more often if: Your lipid or cholesterol levels are high. You are older than 63 years of age. You are at high risk for heart disease. What should I know about cancer screening? Depending on your health history and family history, you may need to have cancer screening at various ages. This may include screening for: Breast cancer. Cervical cancer. Colorectal cancer. Skin cancer. Lung cancer. What should I know about heart disease, diabetes, and high blood pressure? Blood pressure and heart disease High blood pressure causes heart disease and increases the risk of stroke. This is more likely to develop in people who have high blood pressure readings or are overweight. Have your blood pressure checked: Every 3-5 years if you are 25-43 years of age. Every year if you are 14 years old or older. Diabetes Have regular diabetes screenings. This checks your fasting blood sugar level. Have the screening done: Once every three years after age 65 if you are at a normal weight and have a low risk for diabetes. More often and at a younger age if you are overweight or have a high risk for diabetes. What should I know about preventing infection? Hepatitis B If you have a higher risk for hepatitis B, you should be screened for this virus. Talk with your health care provider to find out if you are at risk for hepatitis B infection. Hepatitis C Testing is recommended for: Everyone born from [birth year] through 1967. Anyone with known risk factors for hepatitis C. Sexually transmitted infections (STIs) Get screened for STIs, including gonorrhea and chlamydia,  if: You are sexually active and are younger than 63 years of age. You are older than 63 years of age and your health care provider tells you that you are at risk for this type of infection. Your sexual activity has changed since you were last screened, and you are at increased risk for chlamydia or gonorrhea. Ask your health care provider if you are at risk. Ask your health care provider about whether you are at high risk for HIV. Your health care provider may recommend a prescription medicine to help prevent HIV infection. If you choose to take medicine to prevent HIV, you should first get tested for HIV. You should then be tested every 3 months for as long as you are taking the medicine. Pregnancy If you are about to stop having your period (premenopausal) and you may become pregnant, seek counseling before you get pregnant. Take 400 to 800 micrograms (mcg) of folic acid every day if you become pregnant. Ask for birth control (contraception) if you want to prevent pregnancy. Osteoporosis and menopause Osteoporosis is a disease in which the bones lose minerals and strength with aging. This can result in bone fractures. If you are 80 years old or older, or  if you are at risk for osteoporosis and fractures, ask your health care provider if you should: Be screened for bone loss. Take a calcium or vitamin D supplement to lower your risk of fractures. Be given hormone replacement therapy (HRT) to treat symptoms of menopause. Follow these instructions at home: Alcohol use Do not drink alcohol if: Your health care provider tells you not to drink. You are pregnant, may be pregnant, or are planning to become pregnant. If you drink alcohol: Limit how much you have to: 0-1 drink a day. Know how much alcohol is in your drink. In the [place] one drink equals one 12 oz bottle of beer (355 mL), one 5 oz glass of wine (148 mL), or one 1 oz glass of hard liquor (44 mL). Lifestyle Do not use any products that  contain nicotine or tobacco. These products include cigarettes, chewing tobacco, and vaping devices, such as e-cigarettes. If you need help quitting, ask your health care provider. Do not use street drugs. Do not share needles. Ask your health care provider for help if you need support or information about quitting drugs. General instructions Schedule regular health, dental, and eye exams. Stay current with your vaccines. Tell your health care provider if: You often feel depressed. You have ever been abused or do not feel safe at home. Summary Adopting a healthy lifestyle and getting preventive care are important in promoting health and wellness. Follow your health care provider's instructions about healthy diet, exercising, and getting tested or screened for diseases. Follow your health care provider's instructions on monitoring your cholesterol and blood pressure. This information is not intended to replace advice given to you by your health care provider. Make sure you discuss any questions you have with your health care provider. Document Revised: 08/30/2020 Document Reviewed: 08/30/2020 Elsevier Patient Education  2022 [place], [place] [facility]

## 2021-07-07 ENCOUNTER — Encounter: Payer: Self-pay | Admitting: Obstetrics and Gynecology

## 2021-07-07 NOTE — Telephone Encounter (Signed)
Dr.Jertson patient, she was taking estradiol 0.5 mg tablet.  ?

## 2021-07-07 NOTE — Telephone Encounter (Signed)
This is Dr. Quincy Simmonds reviewing Dr. Gentry Fitz patients while she is out of the office.  ? ?Ok to discontinue transdermal estrogen and switch back to estradiol 0.5 mg daily.  ?Please give refills until her annual exam is due next year.  ?

## 2021-07-08 MED ORDER — ESTRADIOL 0.5 MG PO TABS: 0.5000 mg | ORAL_TABLET | Freq: Every day | ORAL | 3 refills | Status: DC

## 2021-08-29 ENCOUNTER — Encounter: Payer: Self-pay | Admitting: Obstetrics and Gynecology

## 2021-08-30 ENCOUNTER — Other Ambulatory Visit: Payer: Self-pay | Admitting: Obstetrics and Gynecology

## 2021-08-30 DIAGNOSIS — B009 Herpesviral infection, unspecified: Secondary | ICD-10-CM

## 2021-08-30 MED ORDER — VALACYCLOVIR HCL 500 MG PO TABS: ORAL_TABLET | ORAL | 2 refills | Status: DC

## 2021-08-30 NOTE — Telephone Encounter (Signed)
Okay to switch to 90 day supply? ?

## 2021-08-30 NOTE — Progress Notes (Signed)
See mychart message. Valtrex changed to daily.  ?

## 2022-01-13 ENCOUNTER — Other Ambulatory Visit: Payer: Self-pay | Admitting: Obstetrics and Gynecology

## 2022-01-13 DIAGNOSIS — Z1231 Encounter for screening mammogram for malignant neoplasm of breast: Secondary | ICD-10-CM

## 2022-01-16 ENCOUNTER — Ambulatory Visit
Admission: RE | Admit: 2022-01-16 | Discharge: 2022-01-16 | Disposition: A | Discharge status: Home / Self Care | Payer: Commercial Managed Care - HMO | Source: Ambulatory Visit | Attending: Obstetrics and Gynecology | Admitting: Obstetrics and Gynecology

## 2022-01-16 DIAGNOSIS — Z1231 Encounter for screening mammogram for malignant neoplasm of breast: Secondary | ICD-10-CM

## 2022-03-10 ENCOUNTER — Ambulatory Visit: Payer: Commercial Managed Care - HMO | Attending: Cardiology | Admitting: Cardiology

## 2022-03-10 ENCOUNTER — Encounter: Payer: Self-pay | Admitting: Cardiology

## 2022-03-10 VITALS — BP 128/70 | HR 62 | Ht 67.5 in | Wt 176.1 lb

## 2022-03-10 DIAGNOSIS — E7800 Pure hypercholesterolemia, unspecified: Secondary | ICD-10-CM

## 2022-03-10 DIAGNOSIS — Z8249 Family history of ischemic heart disease and other diseases of the circulatory system: Secondary | ICD-10-CM

## 2022-03-10 DIAGNOSIS — Z87891 Personal history of nicotine dependence: Secondary | ICD-10-CM | POA: Diagnosis not present

## 2022-03-10 MED ORDER — ROSUVASTATIN CALCIUM 5 MG PO TABS: 5.0000 mg | ORAL_TABLET | Freq: Every day | ORAL | 3 refills | Status: DC

## 2022-03-10 NOTE — Patient Instructions (Signed)
Medication Instructions:  Your physician has recommended you make the following change in your medication:    Decrease Crestor to 5 mg daily  *If you need a refill on your cardiac medications before your next appointment, please call your pharmacy*   Follow-Up: At [facility], you and your health needs are our priority.  As part of our continuing mission to provide you with exceptional heart care, we have created designated Provider Care Teams.  These Care Teams include your primary Cardiologist (physician) and Advanced Practice Providers (APPs -  Physician Assistants and Nurse Practitioners) who all work together to provide you with the care you need, when you need it.  We recommend signing up for the patient portal called "MyChart".  Sign up information is provided on this After Visit Summary.  MyChart is used to connect with patients for Virtual Visits (Telemedicine).  Patients are able to view lab/test results, encounter notes, upcoming appointments, etc.  Non-urgent messages can be sent to your provider as well.   To learn more about what you can do with MyChart, go to [email or web address].    Your next appointment:   12 month(s)  The format for your next appointment:   In Person  Provider:   Candee Furbish, MD    Important Information About Sugar

## 2022-03-10 NOTE — Progress Notes (Unsigned)
Cardiology Office Note:    Date:  03/11/2022   ID:  Julie Hopkins, DOB 04/01/1961, MRN [id number]  PCP:  Horald Pollen, MD   [facility] Cardiologist:  Candee Furbish, MD     Referring MD: No ref. provider found     History of Present Illness:    Julie Hopkins is a 61 y.o. female for follow-up on CAD.  He was last seen on 09/21/2020 for evaluation of early CAD at request of Dr. Orma Flaming. She reported noticing some decrease in exercise tolerance.She used to be and still is an avid runner. She was noticing quite a decrease in overall endurance after stopping for a little while. She was also noticing some episode of chest discomfort transiently. Her shortness of breath seemed to be more pronounced.   She denied any bleeding, fevers, chills, nausea, vomiting, or syncope.  Her labs were reviewed last visit which included LDL 146, creatinine 0.79, ALT 13, and hemoglobin 13.3.   Today, she has been doing well.   She reported experiencing knee pain while being on the Crestor. She stopped taking the Crestor for 30 days and noticed that the knee pain diminished.  Her CTA and calcium score on 11/22/2020 was reviewed.  She noted that she was a former smoke from ages 61 to 42 y/o.  She has been doing well with physical activity which includes running and strength training.  She denies any palpitations, chest pain, shortness of breath, or peripheral edema. No lightheadedness, headaches, syncope, orthopnea, or PND.  Past Medical History:  Diagnosis Date   Anemia    Depression    GERD (gastroesophageal reflux disease)    Heart murmur    Post-operative nausea and vomiting     Past Surgical History:  Procedure Laterality Date   ABDOMINAL HYSTERECTOMY  2005   COLONOSCOPY  11/08/2018   GYNECOLOGIC CRYOSURGERY     MYOMECTOMY     SALPINGOOPHORECTOMY     both at 2 different surgeries   UPPER GASTROINTESTINAL ENDOSCOPY  11/08/2018   WISDOM TOOTH  EXTRACTION      Current Medications: Current Meds  Medication Sig   buPROPion (WELLBUTRIN XL) 300 MG 24 hr tablet Take 1 tablet (300 mg total) by mouth daily.   Calcium-Phosphorus-Vitamin D (CALCIUM/VITAMIN D3/ADULT GUMMY) 250-100-500 MG-MG-UNIT CHEW Chew 1 Units by mouth daily.   estradiol (ESTRACE) 0.5 MG tablet Take 1 tablet (0.5 mg total) by mouth daily.   ibuprofen (ADVIL) 600 MG tablet Take 600 mg by mouth every 8 (eight) hours as needed for mild pain.   loratadine (CLARITIN) 10 MG tablet Take 10 mg by mouth daily.   Multiple Vitamin (MULTIVITAMIN) tablet Take 1 tablet by mouth daily.   rosuvastatin (CRESTOR) 5 MG tablet Take 1 tablet (5 mg total) by mouth daily.   valACYclovir (VALTREX) 500 MG tablet TAKE ONE TABLET BY MOUTH DAILY, INCREASE TO ONE TABLET TWICE A DAY for 3 days prn OUTBREAK   vitamin B-12 (CYANOCOBALAMIN) 500 MCG tablet Take by mouth.     Allergies:   Morphine sulfate and Sulfa antibiotics   Social History   Socioeconomic History   Marital status: Married    Spouse name: Cortana Vanderford    Number of children: 1   Years of education: Not on file   Highest education level: Not on file  Occupational History   Occupation: Retired  Tobacco Use   Smoking status: Former    Types: Cigarettes    Quit date: 02/07/1998  Years since quitting: 24.1   Smokeless tobacco: Never  Vaping Use   Vaping Use: Never used  Substance and Sexual Activity   Alcohol use: Yes    Alcohol/week: 1.0 - 2.0 standard drink of alcohol    Types: 1 - 2 Glasses of wine per week    Comment: occasionally   Drug use: Never   Sexual activity: Yes    Birth control/protection: Post-menopausal  Other Topics Concern   Not on file  Social History Narrative   Not on file   Social Determinants of Health   Financial Resource Strain: Not on file  Food Insecurity: Not on file  Transportation Needs: Not on file  Physical Activity: Not on file  Stress: Not on file  Social Connections: Not  on file     Family History: The patient's family history includes Alcohol abuse in her brother, brother, and father; COPD in her mother; Cancer in her mother; Depression in her brother, daughter, and father; Early death in her brother, mother, and paternal grandfather; Heart attack in her brother; Heart disease in her brother and mother; Hypertension in her brother; Miscarriages / [place] in her mother; Uterine cancer in her paternal grandmother. There is no history of Colon cancer, Pancreatic cancer, Rectal cancer, Stomach cancer, Esophageal cancer, or Colon polyps.  ROS:   Please see the history of present illness.   All other systems reviewed and are negative.  EKGs/Labs/Other Studies Reviewed:    The following studies were reviewed today:  CTA w/ CA Score 11/22/2020:  FINDINGS:   Calcium score: One punctate area of calcium in LAD   Coronary Arteries: Right dominant with no anomalies   LM: Normal   LAD: 1-24% calcified plaque in mid vessel   D1: Normal large vessel   D2: Normal   Circumflex: Normal   OM1: Normal   RCA: 1-24% soft plaque in proximal vessel   PDA: Normal   PLA: Normal   IMPRESSION: 1. Calcium score 0.4 which is 45 st percentile for age and sex   2.  Normal aortic root 3.2 cm   3.  CAD RADS 1 non obstructive disease in LAD/RCA  1-24%   [place] Some calcified subcarinal lymph nodes are incidentally noted. Within the visualized portions of the thorax there are no suspicious appearing pulmonary nodules or masses, there is no acute consolidative airspace disease, no pleural effusions, no pneumothorax and no lymphadenopathy. Visualized portions of the upper abdomen are unremarkable. There are no aggressive appearing lytic or blastic lesions noted in the visualized portions of the skeleton.   IMPRESSION: 1. No significant incidental noncardiac findings are noted.  Echo 10/18/2020: IMPRESSION: 1. Left ventricular ejection fraction by 3D  volume is 66 %. The left ventricle has normal function. The left ventricle has no regional wall motion abnormalities. Left ventricular diastolic parameters are indeterminate. The average left ventricular global longitudinal strain is -27.2 %. 2. Right ventricular systolic function is normal. The right ventricular size is normal. 3. Left atrial size was mildly dilated. 4. The mitral valve is normal in structure. Mild mitral valve regurgitation. No evidence of mitral stenosis. 5. The aortic valve is normal in structure. Aortic valve regurgitation is trivial. No aortic stenosis is present.  EKG: EKG is personally reviewed. 03/10/2022: NSR. Rate 62 bpm.  09/21/20-EKG is sinus bradycardia 51 no other specific abnormality ordered today.   Recent Labs: 05/12/2021: ALT 16; BUN 12; Creatinine, Ser 0.80; Hemoglobin 12.7; Platelets 229.0; Potassium 4.0; Sodium 140; TSH 0.78  Recent Lipid  Panel    Component Value Date/Time   CHOL 229 (H) 05/12/2021 1107   TRIG 75.0 05/12/2021 1107   HDL 78.30 05/12/2021 1107   CHOLHDL 3 05/12/2021 1107   VLDL 15.0 05/12/2021 1107   LDLCALC 136 (H) 05/12/2021 1107     Risk Assessment/Calculations:      Physical Exam:    VS:  BP 128/70   Pulse 62   Ht 5' 7.5" (1.715 m)   Wt 176 lb 2 oz (79.9 kg)   SpO2 100%   BMI 27.18 kg/m     Wt Readings from Last 3 Encounters:  03/10/22 176 lb 2 oz (79.9 kg)  05/12/21 164 lb (74.4 kg)  05/12/21 166 lb (75.3 kg)     GEN: Well nourished, well developed in no acute distress HEENT: Normal NECK: No JVD; No carotid bruits LYMPHATICS: No lymphadenopathy CARDIAC: RRR, no murmurs, rubs, gallops RESPIRATORY:  Clear to auscultation without rales, wheezing or rhonchi  ABDOMEN: Soft, non-tender, non-distended MUSCULOSKELETAL:  No edema; No deformity  SKIN: Warm and dry NEUROLOGIC:  Alert and oriented x 3 PSYCHIATRIC:  Normal affect   ASSESSMENT:    1. Pure hypercholesterolemia   2. Family history of early CAD    3. Former smoker     PLAN:    In order of problems listed above:  Family history of heart disease --Brother MI died 26, other CHF, mother also CHF. No issues now.  Minimal coronary artery disease - Very mild plaque in the LAD as well as RCA.  Nonflow limiting.  Former smoker --61yr old to 89  Has not smoked since.  There were no incidental lung findings on coronary CT scan.  Hyperlipidemia - LDL 136.  Tried Crestor 10 mg.  Knee pain after running.  She stopped her Crestor and the knee pain went away.  We will trial 5 mg of Crestor instead.  She will message me with results.  Expressed the importance of plaque stabilization.  If intolerant to this, we will have her talk with our lipid clinic to discuss alternatives.   Medication Adjustments/Labs and Tests Ordered: Current medicines are reviewed at length with the patient today.  Concerns regarding medicines are outlined above.  Orders Placed This Encounter  Procedures   EKG 12-Lead   Meds ordered this encounter  Medications   rosuvastatin (CRESTOR) 5 MG tablet    Sig: Take 1 tablet (5 mg total) by mouth daily.    Dispense:  90 tablet    Refill:  3    Patient Instructions  Medication Instructions:  Your physician has recommended you make the following change in your medication:    Decrease Crestor to 5 mg daily  *If you need a refill on your cardiac medications before your next appointment, please call your pharmacy*   Follow-Up: [facility] you and your health needs are our priority.  As part of our continuing mission to provide you with exceptional heart care, we have created designated Provider Care Teams.  These Care Teams include your primary Cardiologist (physician) and Advanced Practice Providers (APPs -  Physician Assistants and Nurse Practitioners) who all work together to provide you with the care you need, when you need it.  We recommend signing up for the patient portal called "MyChart".  Sign  up information is provided on this After Visit Summary.  MyChart is used to connect with patients for Virtual Visits (Telemedicine).  Patients are able to view lab/test results, encounter notes, upcoming appointments, etc.  Non-urgent messages can be sent to your provider as well.   To learn more about what you can do with MyChart, go to [email or web address].    Your next appointment:   12 month(s)  The format for your next appointment:   In Person  Provider:   Candee Furbish, MD    Important Information About Sugar          Ericka Pontiff as a scribe for Candee Furbish, MD.,have documented all relevant documentation on the behalf of Candee Furbish, MD,as directed by  Candee Furbish, MD while in the presence of Candee Furbish, MD.  I, Candee Furbish, MD, have reviewed all documentation for this visit. The documentation on 03/11/22 for the exam, diagnosis, procedures, and orders are all accurate and complete.   Signed, Candee Furbish, MD  03/11/2022 12:02 PM    [facility]

## 2022-05-16 ENCOUNTER — Encounter: Payer: Self-pay | Admitting: Emergency Medicine

## 2022-05-16 ENCOUNTER — Ambulatory Visit: Payer: Commercial Managed Care - HMO | Admitting: Emergency Medicine

## 2022-05-16 ENCOUNTER — Encounter: Payer: Managed Care, Other (non HMO) | Admitting: Emergency Medicine

## 2022-05-16 VITALS — BP 122/70 | HR 62 | Temp 98.1°F | Ht 67.5 in | Wt 173.2 lb

## 2022-05-16 DIAGNOSIS — R399 Unspecified symptoms and signs involving the genitourinary system: Secondary | ICD-10-CM | POA: Diagnosis not present

## 2022-05-16 DIAGNOSIS — Z87891 Personal history of nicotine dependence: Secondary | ICD-10-CM

## 2022-05-16 DIAGNOSIS — Z13220 Encounter for screening for lipoid disorders: Secondary | ICD-10-CM

## 2022-05-16 DIAGNOSIS — Z0000 Encounter for general adult medical examination without abnormal findings: Secondary | ICD-10-CM | POA: Diagnosis not present

## 2022-05-16 DIAGNOSIS — Z122 Encounter for screening for malignant neoplasm of respiratory organs: Secondary | ICD-10-CM

## 2022-05-16 DIAGNOSIS — Z13228 Encounter for screening for other metabolic disorders: Secondary | ICD-10-CM

## 2022-05-16 DIAGNOSIS — Z1329 Encounter for screening for other suspected endocrine disorder: Secondary | ICD-10-CM

## 2022-05-16 DIAGNOSIS — Z130 Encounter for screening for diseases of the blood and blood-forming organs and certain disorders involving the immune mechanism: Secondary | ICD-10-CM | POA: Diagnosis not present

## 2022-05-16 DIAGNOSIS — F339 Major depressive disorder, recurrent, unspecified: Secondary | ICD-10-CM

## 2022-05-16 LAB — CBC WITH DIFFERENTIAL/PLATELET
Basophils Absolute: 0 10*3/uL (ref 0.0–0.1)
Basophils Relative: 1.1 % (ref 0.0–3.0)
Eosinophils Absolute: 0.1 10*3/uL (ref 0.0–0.7)
Eosinophils Relative: 2 % (ref 0.0–5.0)
HCT: 38.5 % (ref 36.0–46.0)
Hemoglobin: 13.2 g/dL (ref 12.0–15.0)
Lymphocytes Relative: 39.9 % (ref 12.0–46.0)
Lymphs Abs: 1.4 10*3/uL (ref 0.7–4.0)
MCHC: 34.2 g/dL (ref 30.0–36.0)
MCV: 98.5 fl (ref 78.0–100.0)
Monocytes Absolute: 0.3 10*3/uL (ref 0.1–1.0)
Monocytes Relative: 8.6 % (ref 3.0–12.0)
Neutro Abs: 1.7 10*3/uL (ref 1.4–7.7)
Neutrophils Relative %: 48.4 % (ref 43.0–77.0)
Platelets: 213 10*3/uL (ref 150.0–400.0)
RBC: 3.91 Mil/uL (ref 3.87–5.11)
RDW: 13.4 % (ref 11.5–15.5)
WBC: 3.5 10*3/uL — ABNORMAL LOW (ref 4.0–10.5)

## 2022-05-16 LAB — COMPREHENSIVE METABOLIC PANEL
ALT: 14 U/L (ref 0–35)
AST: 21 U/L (ref 0–37)
Albumin: 4.2 g/dL (ref 3.5–5.2)
Alkaline Phosphatase: 44 U/L (ref 39–117)
BUN: 13 mg/dL (ref 6–23)
CO2: 29 mEq/L (ref 19–32)
Calcium: 9.1 mg/dL (ref 8.4–10.5)
Chloride: 105 mEq/L (ref 96–112)
Creatinine, Ser: 0.79 mg/dL (ref 0.40–1.20)
GFR: 79.71 mL/min (ref >60.00)
Glucose, Bld: 84 mg/dL (ref 70–99)
Potassium: 4.3 mEq/L (ref 3.5–5.1)
Sodium: 141 mEq/L (ref 135–145)
Total Bilirubin: 0.5 mg/dL (ref 0.2–1.2)
Total Protein: 6.7 g/dL (ref 6.0–8.3)

## 2022-05-16 LAB — URINALYSIS
Bilirubin Urine: NEGATIVE
Hgb urine dipstick: NEGATIVE
Ketones, ur: NEGATIVE
Leukocytes,Ua: NEGATIVE
Nitrite: NEGATIVE
Specific Gravity, Urine: <=1.005 — AB (ref 1.000–1.030)
Total Protein, Urine: NEGATIVE
Urine Glucose: NEGATIVE
Urobilinogen, UA: 0.2 (ref 0.0–1.0)
pH: 7 (ref 5.0–8.0)

## 2022-05-16 LAB — LIPID PANEL
Cholesterol: 178 mg/dL (ref 0–200)
HDL: 72.8 mg/dL (ref >39.00)
LDL Cholesterol: 94 mg/dL (ref 0–99)
NonHDL: 105.25
Total CHOL/HDL Ratio: 2
Triglycerides: 56 mg/dL (ref 0.0–149.0)
VLDL: 11.2 mg/dL (ref 0.0–40.0)

## 2022-05-16 LAB — HEMOGLOBIN A1C: Hgb A1c MFr Bld: 5.5 % (ref 4.6–6.5)

## 2022-05-16 MED ORDER — BUPROPION HCL ER (XL) 300 MG PO TB24: 300.0000 mg | ORAL_TABLET | Freq: Every day | ORAL | 3 refills | Status: DC

## 2022-05-16 NOTE — Progress Notes (Signed)
Julie Hopkins 62 y.o.   Chief Complaint  Patient presents with   Annual Exam    Urinary urgency, back pain started a few months     HISTORY OF PRESENT ILLNESS: This is a 62 y.o. female here for annual exam. Has some urinary urgency for 2 months on and off. Occasional back pain.  Works out regularly. Former smoker about 25 years 1 pack/day.  HPI   Prior to Admission medications   Medication Sig Start Date End Date Taking? Authorizing Provider  Calcium-Phosphorus-Vitamin D (CALCIUM/VITAMIN D3/ADULT GUMMY) [phone] MG-MG-UNIT CHEW Chew 1 Units by mouth daily.   Yes [provider]  estradiol (ESTRACE) 0.5 MG tablet Take 1 tablet (0.5 mg total) by mouth daily. 07/08/21  Yes Amundson Raliegh Ip, MD  ibuprofen (ADVIL) 600 MG tablet Take 600 mg by mouth every 8 (eight) hours as needed for mild pain. 06/09/19  Yes [provider]  loratadine (CLARITIN) 10 MG tablet Take 10 mg by mouth daily.   Yes [provider]  Multiple Vitamin (MULTIVITAMIN) tablet Take 1 tablet by mouth daily.   Yes [provider]  rosuvastatin (CRESTOR) 5 MG tablet Take 1 tablet (5 mg total) by mouth daily. 03/10/22  Yes Jerline Pain, MD  valACYclovir (VALTREX) 500 MG tablet TAKE ONE TABLET BY MOUTH DAILY, INCREASE TO ONE TABLET TWICE A DAY for 3 days prn OUTBREAK 08/30/21  Yes Salvadore Dom, MD  vitamin B-12 (CYANOCOBALAMIN) 500 MCG tablet Take by mouth.   Yes [provider]  buPROPion (WELLBUTRIN XL) 300 MG 24 hr tablet Take 1 tablet (300 mg total) by mouth daily. 05/12/21 03/10/22  Horald Pollen, MD    Allergies  Allergen Reactions   Morphine Sulfate Itching   Sulfa Antibiotics Palpitations    Pt's mother said she was always allergic to it. Never taken med.     Patient Active Problem List   Diagnosis Date Noted   B12 deficiency 09/26/2019   Hyperlipidemia 06/09/2019   Depression, recurrent ([place]) 04/29/2018   Primary insomnia  04/29/2018   Situational anxiety 04/29/2018    Past Medical History:  Diagnosis Date   Anemia    Depression    GERD (gastroesophageal reflux disease)    Heart murmur    Post-operative nausea and vomiting     Past Surgical History:  Procedure Laterality Date   ABDOMINAL HYSTERECTOMY  2005   COLONOSCOPY  11/08/2018   GYNECOLOGIC CRYOSURGERY     MYOMECTOMY     SALPINGOOPHORECTOMY     both at 2 different surgeries   UPPER GASTROINTESTINAL ENDOSCOPY  11/08/2018   WISDOM TOOTH EXTRACTION      Social History   Socioeconomic History   Marital status: Married    Spouse name: Tongela Encinas    Number of children: 1   Years of education: Not on file   Highest education level: Not on file  Occupational History   Occupation: Retired  Tobacco Use   Smoking status: Former    Types: Cigarettes    Quit date: 02/07/1998    Years since quitting: 24.2   Smokeless tobacco: Never  Vaping Use   Vaping Use: Never used  Substance and Sexual Activity   Alcohol use: Yes    Alcohol/week: 1.0 - 2.0 standard drink of alcohol    Types: 1 - 2 Glasses of wine per week    Comment: occasionally   Drug use: Never   Sexual activity: Yes    Birth control/protection: Post-menopausal  Other Topics Concern   Not on file  Social History Narrative   Not on file   Social Determinants of Health   Financial Resource Strain: Not on file  Food Insecurity: Not on file  Transportation Needs: Not on file  Physical Activity: Not on file  Stress: Not on file  Social Connections: Not on file  Intimate Partner Violence: Not on file    Family History  Problem Relation Age of Onset   Cancer Mother    COPD Mother    Early death Mother    Heart disease Mother    Miscarriages / [place] Mother    Alcohol abuse Father    Depression Father    Alcohol abuse Brother    Early death Brother    Heart attack Brother    Hypertension Brother    Uterine cancer Paternal Grandmother    Early death  Paternal Grandfather    Depression Daughter    Alcohol abuse Brother    Depression Brother    Heart disease Brother    Colon cancer Neg Hx    Pancreatic cancer Neg Hx    Rectal cancer Neg Hx    Stomach cancer Neg Hx    Esophageal cancer Neg Hx    Colon polyps Neg Hx      Review of Systems  Constitutional: Negative.  Negative for chills and fever.  HENT: Negative.  Negative for congestion and sore throat.   Respiratory:  Negative for cough and shortness of breath.   Cardiovascular: Negative.  Negative for chest pain and palpitations.  Gastrointestinal: Negative.  Negative for abdominal pain, diarrhea, nausea and vomiting.  Genitourinary:  Positive for frequency.  Musculoskeletal:  Positive for back pain.  Skin: Negative.  Negative for rash.  Neurological: Negative.  Negative for dizziness and headaches.  All other systems reviewed and are negative.   Today's Vitals   05/16/22 0925  BP: 122/70  Pulse: 62  Temp: 98.1 F (36.7 C)  TempSrc: Oral  SpO2: 98%  Weight: 173 lb 4 oz (78.6 kg)  Height: 5' 7.5" (1.715 m)   Body mass index is 26.73 kg/m. Wt Readings from Last 3 Encounters:  05/16/22 173 lb 4 oz (78.6 kg)  03/10/22 176 lb 2 oz (79.9 kg)  05/12/21 164 lb (74.4 kg)    Physical Exam Vitals reviewed.  Constitutional:      Appearance: Normal appearance.  HENT:     Head: Normocephalic.     Right Ear: Tympanic membrane, ear canal and external ear normal.     Left Ear: Tympanic membrane, ear canal and external ear normal.     Mouth/Throat:     Mouth: Mucous membranes are moist.     Pharynx: Oropharynx is clear.  Eyes:     Extraocular Movements: Extraocular movements intact.     Conjunctiva/sclera: Conjunctivae normal.     Pupils: Pupils are equal, round, and reactive to light.  Cardiovascular:     Rate and Rhythm: Normal rate and regular rhythm.     Pulses: Normal pulses.     Heart sounds: Normal heart sounds.  Pulmonary:     Effort: Pulmonary effort is  normal.     Breath sounds: Normal breath sounds.  Abdominal:     Palpations: Abdomen is soft.     Tenderness: There is no abdominal tenderness.  Musculoskeletal:     Cervical back: No tenderness.     Right lower leg: No edema.     Left lower leg: No edema.  Lymphadenopathy:  Cervical: No cervical adenopathy.  Skin:    General: Skin is warm and dry.  Neurological:     General: No focal deficit present.     Mental Status: She is alert and oriented to person, place, and time.  Psychiatric:        Mood and Affect: Mood normal.        Behavior: Behavior normal.      ASSESSMENT & PLAN: Problem List Items Addressed This Visit       Other   Depression, recurrent ([place])   Relevant Medications   buPROPion (WELLBUTRIN XL) 300 MG 24 hr tablet   Other Visit Diagnoses     Routine general medical examination at a health care facility    -  Primary   Relevant Orders   CBC with Differential   Comprehensive metabolic panel   Hemoglobin A1c   Lipid panel   Former smoker       Relevant Orders   Urinalysis   Ambulatory referral to Urology   Screening for lung cancer       Relevant Orders   Ambulatory Referral Lung Cancer Screening [facility]   Lower urinary tract symptoms       Relevant Orders   Urinalysis   Ambulatory referral to Urology   Screening for deficiency anemia       Relevant Orders   CBC with Differential   Screening for lipoid disorders       Relevant Orders   Lipid panel   Screening for endocrine, metabolic and immunity disorder       Relevant Orders   Comprehensive metabolic panel   Hemoglobin A1c      Modifiable risk factors discussed with patient. Anticipatory guidance according to age provided. The following topics were also discussed: Social Determinants of Health Smoking.  Former smoker for 25 years 1 pack/day.  Will refer for lung cancer screening Diet and nutrition.  Good eating habits Benefits of exercise.  X-ray sinuses  regularly Cancer screening and review of most recent mammogram and colonoscopy reports Vaccinations reviewed recommendations Cardiovascular risk assessment and need for blood work Presence of lower urinary tract symptoms and need for urology referral given her past history of smoking Mental health including depression and anxiety Fall and accident prevention  Patient Instructions  Health Maintenance, Female Adopting a healthy lifestyle and getting preventive care are important in promoting health and wellness. Ask your health care provider about: The right schedule for you to have regular tests and exams. Things you can do on your own to prevent diseases and keep yourself healthy. What should I know about diet, weight, and exercise? Eat a healthy diet  Eat a diet that includes plenty of vegetables, fruits, low-fat dairy products, and lean protein. Do not eat a lot of foods that are high in solid fats, added sugars, or sodium. Maintain a healthy weight Body mass index (BMI) is used to identify weight problems. It estimates body fat based on height and weight. Your health care provider can help determine your BMI and help you achieve or maintain a healthy weight. Get regular exercise Get regular exercise. This is one of the most important things you can do for your health. Most adults should: Exercise for at least 150 minutes each week. The exercise should increase your heart rate and make you sweat (moderate-intensity exercise). Do strengthening exercises at least twice a week. This is in addition to the moderate-intensity exercise. Spend less time sitting. Even light physical activity can be beneficial.  Watch cholesterol and blood lipids Have your blood tested for lipids and cholesterol at 64 years of age, then have this test every 5 years. Have your cholesterol levels checked more often if: Your lipid or cholesterol levels are high. You are older than 64 years of age. You are at high  risk for heart disease. What should I know about cancer screening? Depending on your health history and family history, you may need to have cancer screening at various ages. This may include screening for: Breast cancer. Cervical cancer. Colorectal cancer. Skin cancer. Lung cancer. What should I know about heart disease, diabetes, and high blood pressure? Blood pressure and heart disease High blood pressure causes heart disease and increases the risk of stroke. This is more likely to develop in people who have high blood pressure readings or are overweight. Have your blood pressure checked: Every 3-5 years if you are 22-90 years of age. Every year if you are 72 years old or older. Diabetes Have regular diabetes screenings. This checks your fasting blood sugar level. Have the screening done: Once every three years after age 65 if you are at a normal weight and have a low risk for diabetes. More often and at a younger age if you are overweight or have a high risk for diabetes. What should I know about preventing infection? Hepatitis B If you have a higher risk for hepatitis B, you should be screened for this virus. Talk with your health care provider to find out if you are at risk for hepatitis B infection. Hepatitis C Testing is recommended for: Everyone born from [birth year] through 1967. Anyone with known risk factors for hepatitis C. Sexually transmitted infections (STIs) Get screened for STIs, including gonorrhea and chlamydia, if: You are sexually active and are younger than 64 years of age. You are older than 64 years of age and your health care provider tells you that you are at risk for this type of infection. Your sexual activity has changed since you were last screened, and you are at increased risk for chlamydia or gonorrhea. Ask your health care provider if you are at risk. Ask your health care provider about whether you are at high risk for HIV. Your health care provider may  recommend a prescription medicine to help prevent HIV infection. If you choose to take medicine to prevent HIV, you should first get tested for HIV. You should then be tested every 3 months for as long as you are taking the medicine. Pregnancy If you are about to stop having your period (premenopausal) and you may become pregnant, seek counseling before you get pregnant. Take 400 to 800 micrograms (mcg) of folic acid every day if you become pregnant. Ask for birth control (contraception) if you want to prevent pregnancy. Osteoporosis and menopause Osteoporosis is a disease in which the bones lose minerals and strength with aging. This can result in bone fractures. If you are 36 years old or older, or if you are at risk for osteoporosis and fractures, ask your health care provider if you should: Be screened for bone loss. Take a calcium or vitamin D supplement to lower your risk of fractures. Be given hormone replacement therapy (HRT) to treat symptoms of menopause. Follow these instructions at home: Alcohol use Do not drink alcohol if: Your health care provider tells you not to drink. You are pregnant, may be pregnant, or are planning to become pregnant. If you drink alcohol: Limit how much you have to: 0-1 drink a  day. Know how much alcohol is in your drink. In the [place] one drink equals one 12 oz bottle of beer (355 mL), one 5 oz glass of wine (148 mL), or one 1 oz glass of hard liquor (44 mL). Lifestyle Do not use any products that contain nicotine or tobacco. These products include cigarettes, chewing tobacco, and vaping devices, such as e-cigarettes. If you need help quitting, ask your health care provider. Do not use street drugs. Do not share needles. Ask your health care provider for help if you need support or information about quitting drugs. General instructions Schedule regular health, dental, and eye exams. Stay current with your vaccines. Tell your health care provider  if: You often feel depressed. You have ever been abused or do not feel safe at home. Summary Adopting a healthy lifestyle and getting preventive care are important in promoting health and wellness. Follow your health care provider's instructions about healthy diet, exercising, and getting tested or screened for diseases. Follow your health care provider's instructions on monitoring your cholesterol and blood pressure. This information is not intended to replace advice given to you by your health care provider. Make sure you discuss any questions you have with your health care provider. Document Revised: 08/30/2020 Document Reviewed: 08/30/2020 Elsevier Patient Education  [place], MD [facility]

## 2022-05-16 NOTE — Patient Instructions (Signed)

## 2022-07-05 NOTE — Progress Notes (Signed)
62 y.o. G73P1011 Widowed White or Caucasian Not Hispanic or Latino female here for annual exam.  She has been only taking half of her estradiol tablet. She just started the 1/2 a tablet a few weeks ago. Doing okay, feels slightly anxious since decreasing the dose. No vaginal bleeding.    H/O genital hsv, has been on suppression.    H/O mixed incontinence, improved with pelvic floor exercises.   No LMP recorded. Patient has had a hysterectomy.          Sexually active: No.  The current method of family planning is status post hysterectomy.    Exercising: Yes.     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  TDaP:  10/01/2018 Gardasil: n/a   reports that she quit smoking about 24 years ago. Her smoking use included cigarettes. She has never used smokeless tobacco. She reports current alcohol use of about 1.0 - 2.0 standard drink of alcohol per week. She reports that she does not use drugs. Retired. Daughter is living in [place].   Past Medical History:  Diagnosis Date   Anemia    Depression    GERD (gastroesophageal reflux disease)    Heart murmur    Post-operative nausea and vomiting     Past Surgical History:  Procedure Laterality Date   ABDOMINAL HYSTERECTOMY  2005   COLONOSCOPY  11/08/2018   GYNECOLOGIC CRYOSURGERY     MYOMECTOMY     SALPINGOOPHORECTOMY     both at 2 different surgeries   UPPER GASTROINTESTINAL ENDOSCOPY  11/08/2018   WISDOM TOOTH EXTRACTION      Current Outpatient Medications  Medication Sig Dispense Refill   buPROPion (WELLBUTRIN XL) 300 MG 24 hr tablet Take 1 tablet (300 mg total) by mouth daily. 90 tablet 3   Calcium-Phosphorus-Vitamin D (CALCIUM/VITAMIN D3/ADULT GUMMY) 250-100-500 MG-MG-UNIT CHEW Chew 1 Units by mouth daily.     estradiol  (ESTRACE) 0.5 MG tablet Take 1 tablet (0.5 mg total) by mouth daily. 90 tablet 3   ibuprofen (ADVIL) 600 MG tablet Take 600 mg by mouth every 8 (eight) hours as needed for mild pain.     loratadine (CLARITIN) 10 MG tablet Take 10 mg by mouth daily.     Multiple Vitamin (MULTIVITAMIN) tablet Take 1 tablet by mouth daily.     rosuvastatin (CRESTOR) 5 MG tablet Take 1 tablet (5 mg total) by mouth daily. 90 tablet 3   valACYclovir (VALTREX) 500 MG tablet TAKE ONE TABLET BY MOUTH DAILY, INCREASE TO ONE TABLET TWICE A DAY for 3 days prn OUTBREAK 90 tablet 2   vitamin B-12 (CYANOCOBALAMIN) 500 MCG tablet Take by mouth.     No current facility-administered medications for this visit.    Family History  Problem Relation Age of Onset   Cancer Mother    COPD Mother    Early death Mother    Heart disease Mother    Miscarriages / [place] Mother    Alcohol abuse Father    Depression Father    Alcohol abuse Brother    Early death Brother    Heart attack Brother    Hypertension Brother    Uterine cancer Paternal Grandmother    Early death Paternal Grandfather    Depression Daughter  Alcohol abuse Brother    Depression Brother    Heart disease Brother    Colon cancer Neg Hx    Pancreatic cancer Neg Hx    Rectal cancer Neg Hx    Stomach cancer Neg Hx    Esophageal cancer Neg Hx    Colon polyps Neg Hx     Review of Systems  All other systems reviewed and are negative.   Exam:   BP 122/64   Pulse 74   Ht 5' 6.54" (1.69 m)   Wt 171 lb (77.6 kg)   SpO2 98%   BMI 27.16 kg/m   Weight change: @WEIGHTCHANGE @ Height:   Height: 5' 6.54" (169 cm)  Ht Readings from Last 3 Encounters:  07/18/22 5' 6.54" (1.69 m)  05/16/22 5' 7.5" (1.715 m)  03/10/22 5' 7.5" (1.715 m)    General appearance: alert, cooperative and appears stated age Head: Normocephalic, without obvious abnormality, atraumatic Neck: no adenopathy, supple, symmetrical, trachea midline and thyroid normal to inspection and  palpation Lungs: clear to auscultation bilaterally Cardiovascular: regular rate and rhythm Breasts: normal appearance, no masses or tenderness Abdomen: soft, non-tender; non distended,  no masses,  no organomegaly Extremities: extremities normal, atraumatic, no cyanosis or edema Skin: Skin color, texture, turgor normal. No rashes or lesions Lymph nodes: Cervical, supraclavicular, and axillary nodes normal. No abnormal inguinal nodes palpated Neurologic: Grossly normal   Pelvic: External genitalia:  no lesions              Urethra:  normal appearing urethra with no masses, tenderness or lesions              Bartholins and Skenes: normal                 Vagina: normal appearing vagina with normal color and discharge, no lesions              Cervix: absent               Bimanual Exam:  Uterus:  uterus absent              Adnexa: no mass, fullness, tenderness               Rectovaginal: Confirms               Anus:  normal sphincter tone, no lesions  Gae Dry, CMA chaperoned for the exam.  1. Well woman exam Discussed breast self exam Discussed calcium and vit D intake Labs with primary Mammogram and colonoscopy are UTD No pap this year  2. HSV-2 (herpes simplex virus 2) infection - valACYclovir (VALTREX) 500 MG tablet; TAKE ONE TABLET BY MOUTH DAILY, INCREASE TO ONE TABLET TWICE A DAY for 3 days prn OUTBREAK  Dispense: 90 tablet; Refill: 4  3. Encounter for monitoring postmenopausal estrogen replacement therapy Doing well, wants to continue, reviewed the risks.  - estradiol (ESTRACE) 0.5 MG tablet; Take 1 tablet (0.5 mg total) by mouth daily.  Dispense: 90 tablet; Refill: 3

## 2022-07-18 ENCOUNTER — Encounter: Payer: Self-pay | Admitting: Obstetrics and Gynecology

## 2022-07-18 ENCOUNTER — Ambulatory Visit (INDEPENDENT_AMBULATORY_CARE_PROVIDER_SITE_OTHER): Payer: Commercial Managed Care - HMO | Admitting: Obstetrics and Gynecology

## 2022-07-18 VITALS — BP 122/64 | HR 74 | Ht 66.54 in | Wt 171.0 lb

## 2022-07-18 DIAGNOSIS — B009 Herpesviral infection, unspecified: Secondary | ICD-10-CM | POA: Diagnosis not present

## 2022-07-18 DIAGNOSIS — Z79890 Hormone replacement therapy (postmenopausal): Secondary | ICD-10-CM

## 2022-07-18 DIAGNOSIS — Z01419 Encounter for gynecological examination (general) (routine) without abnormal findings: Secondary | ICD-10-CM

## 2022-07-18 DIAGNOSIS — Z5181 Encounter for therapeutic drug level monitoring: Secondary | ICD-10-CM

## 2022-07-18 MED ORDER — VALACYCLOVIR HCL 500 MG PO TABS: ORAL_TABLET | ORAL | 4 refills | Status: DC

## 2022-07-18 MED ORDER — ESTRADIOL 0.5 MG PO TABS: 0.5000 mg | ORAL_TABLET | Freq: Every day | ORAL | 3 refills | Status: DC

## 2022-07-18 NOTE — Patient Instructions (Signed)

## 2022-12-04 ENCOUNTER — Other Ambulatory Visit: Payer: Self-pay | Admitting: Emergency Medicine

## 2022-12-04 DIAGNOSIS — Z1231 Encounter for screening mammogram for malignant neoplasm of breast: Secondary | ICD-10-CM

## 2023-01-25 ENCOUNTER — Ambulatory Visit
Admission: RE | Admit: 2023-01-25 | Discharge: 2023-01-25 | Disposition: A | Discharge status: Home / Self Care | Payer: Managed Care, Other (non HMO) | Source: Ambulatory Visit | Attending: Emergency Medicine | Admitting: Emergency Medicine

## 2023-01-25 DIAGNOSIS — Z1231 Encounter for screening mammogram for malignant neoplasm of breast: Secondary | ICD-10-CM

## 2023-05-16 ENCOUNTER — Other Ambulatory Visit: Payer: Self-pay | Admitting: Medical Genetics

## 2023-06-04 ENCOUNTER — Other Ambulatory Visit: Payer: Self-pay | Admitting: Cardiology

## 2023-06-11 ENCOUNTER — Other Ambulatory Visit (HOSPITAL_COMMUNITY)
Admission: RE | Admit: 2023-06-11 | Discharge: 2023-06-11 | Disposition: A | Discharge status: Home / Self Care | Payer: Self-pay | Source: Ambulatory Visit | Attending: Medical Genetics | Admitting: Medical Genetics

## 2023-06-20 LAB — GENECONNECT MOLECULAR SCREEN: Genetic Analysis Overall Interpretation: NEGATIVE

## 2023-06-21 ENCOUNTER — Ambulatory Visit: Payer: 59 | Admitting: Sports Medicine

## 2023-06-21 VITALS — BP 124/86 | Ht 67.5 in | Wt 170.0 lb

## 2023-06-21 DIAGNOSIS — M79605 Pain in left leg: Secondary | ICD-10-CM

## 2023-06-21 NOTE — Progress Notes (Addendum)
   PCP: Georgina Quint, MD  SUBJECTIVE:   HPI:  Patient is a 63 y.o. female here with chief complaint of left leg pain.  Initially started in October after falling on a curb landing on the outside of her leg. She rested it for a while and noted it had improved, however 3 weeks ago she stumbled walking down the stairs and flared up the lateral hip pain, but this was accompanied by anterior thigh pain and medial + lateral knee pain. She also notes some numbness/tingling over the anterior thigh on the left as well. She rested it again and notes some improvement in the numbness and pain, but it is still present. Denies any back pain. She has been able to go for a 1-2 mi walk the past couple days without much issue. She works on hip strengthening regularly.   Pertinent ROS were reviewed with the patient and found to be negative unless otherwise specified above in HPI.   PERTINENT  PMH / PSH / FH / SH:  Past Medical, Surgical, Social, and Family History Reviewed & Updated in the EMR.  Pertinent findings include:  Non-contributory  Allergies  Allergen Reactions   Morphine Sulfate Itching   Sulfa Antibiotics Palpitations    Pt's mother said she was always allergic to it. Never taken med.    OBJECTIVE:  BP 124/86   Ht 5' 7.5" (1.715 m)   Wt 170 lb (77.1 kg)   BMI 26.23 kg/m   PHYSICAL EXAM:  GEN: Alert and Oriented, NAD, comfortable in exam room RESP: Unlabored respirations, symmetric chest rise PSY: normal mood, congruent affect   MSK EXAM: LEFT HIP No deformity. FROM without pain. Negative FABER, FADIR, and piriformis stretch. Strength 5/5 on hip flexion, adduction, and abduction (+pain). Tenderness to palpation at greater trochanter and distal glute med/min. Neurovascularly intact distally. Negative logroll and stinchfield. + pain and weakness with sartorius compared to right.  LEFT KNEE No gross deformity, ecchymoses, swelling. TTP at pes anserine, lateral joint line  and distal IT band - otherwise non-tender. FROM with normal strength. Negative ant/post drawers. Negative valgus/varus testing. Negative mcmurrays, apleys. NV intact distally. Assessment & Plan Left leg pain Presentation most consistent with greater trochanteric pain syndrome, IT band syndrome, and sartorius strain with component of stress to lateral femoral cutaneous nerve. 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 Hip abductors are strong though would benefit from rehabilitation in light of GTPS.  Plan: -Activity modification and home exercises focused on hip abductor strengthening, sartorius strengthening, and IT band stretches. -Ice and NSAIDs prn -F/u in 6 weeks, discussed possibility of treating with ECSWT if failing to improve.   Glean Salen, MD PGY-4, Sports Medicine Fellow [facility]  I observed and examined the patient with the [facility] resident and agree with assessment and plan.  Note reviewed and modified by me. Sterling Big, MD

## 2023-06-27 ENCOUNTER — Telehealth: Payer: Self-pay | Admitting: Emergency Medicine

## 2023-06-27 NOTE — Telephone Encounter (Signed)
 Copied from CRM [id number]. Topic: Clinical - Medication Question >> Jun 27, 2023 11:09 AM Julie Hopkins wrote: Reason for CRM: Patient leaves to go out of town at 2:00. Patient wants to know if she can get a 90 day supply of the buPROPion (WELLBUTRIN XL) 300 MG 24 hr tablet since she did schedule an appointment.

## 2023-06-28 ENCOUNTER — Ambulatory Visit (INDEPENDENT_AMBULATORY_CARE_PROVIDER_SITE_OTHER): Admitting: Emergency Medicine

## 2023-06-28 ENCOUNTER — Encounter: Payer: Self-pay | Admitting: Emergency Medicine

## 2023-06-28 VITALS — BP 122/78 | HR 64 | Temp 98.6°F | Ht 67.5 in | Wt 176.0 lb

## 2023-06-28 DIAGNOSIS — F339 Major depressive disorder, recurrent, unspecified: Secondary | ICD-10-CM

## 2023-06-28 DIAGNOSIS — Z760 Encounter for issue of repeat prescription: Secondary | ICD-10-CM | POA: Diagnosis not present

## 2023-06-28 DIAGNOSIS — B009 Herpesviral infection, unspecified: Secondary | ICD-10-CM | POA: Insufficient documentation

## 2023-06-28 MED ORDER — VALACYCLOVIR HCL 500 MG PO TABS: ORAL_TABLET | ORAL | 4 refills | Status: DC

## 2023-06-28 MED ORDER — BUPROPION HCL ER (XL) 300 MG PO TB24: 300.0000 mg | ORAL_TABLET | Freq: Every day | ORAL | 3 refills | Status: DC

## 2023-06-28 NOTE — Patient Instructions (Signed)

## 2023-06-28 NOTE — Assessment & Plan Note (Signed)
 Clinically stable.  Mental health in good shape. Continue Wellbutrin 300 mg daily Follow-up for general annual physical exam sometime this year at your convenience.

## 2023-06-28 NOTE — Assessment & Plan Note (Signed)
 Taking valacyclovir 500 mg daily for prophylaxis No recent flareups

## 2023-06-28 NOTE — Progress Notes (Signed)
 Julie Hopkins 63 y.o.   Chief Complaint  Patient presents with   Medication Refill    Patient here for medication refill for Wellbutrin and Valacyclovir.    HISTORY OF PRESENT ILLNESS: This is a 63 y.o. female here for medication refill Has been taking Wellbutrin and valacyclovir for a while Overall doing well. No other complaints or medical concerns today.  Medication Refill Pertinent negatives include no abdominal pain, chest pain, chills, congestion, coughing, fever, headaches, nausea, rash, sore throat or vomiting.     Prior to Admission medications   Medication Sig Start Date End Date Taking? Authorizing Provider  Calcium-Phosphorus-Vitamin D (CALCIUM/VITAMIN D3/ADULT GUMMY) 250-100-500 MG-MG-UNIT CHEW Chew 1 Units by mouth daily.   Yes [provider]  estradiol (ESTRACE) 0.5 MG tablet Take 1 tablet (0.5 mg total) by mouth daily. 07/18/22  Yes Romualdo Bolk, MD  ibuprofen (ADVIL) 600 MG tablet Take 600 mg by mouth every 8 (eight) hours as needed for mild pain. 06/09/19  Yes [provider]  loratadine (CLARITIN) 10 MG tablet Take 10 mg by mouth daily.   Yes [provider]  Multiple Vitamin (MULTIVITAMIN) tablet Take 1 tablet by mouth daily.   Yes [provider]  rosuvastatin (CRESTOR) 5 MG tablet TAKE 1 TABLET BY MOUTH EVERY DAY 06/05/23  Yes Jake Bathe, MD  vitamin B-12 (CYANOCOBALAMIN) 500 MCG tablet Take by mouth.   Yes [provider]  buPROPion (WELLBUTRIN XL) 300 MG 24 hr tablet Take 1 tablet (300 mg total) by mouth daily. 06/28/23 06/22/24  Georgina Quint, MD  valACYclovir (VALTREX) 500 MG tablet TAKE ONE TABLET BY MOUTH DAILY, INCREASE TO ONE TABLET TWICE A DAY for 3 days prn OUTBREAK 06/28/23   Georgina Quint, MD    Allergies  Allergen Reactions   Morphine Sulfate Itching   Sulfa Antibiotics Palpitations    Pt's mother said she was always allergic to it. Never taken med.     Patient Active  Problem List   Diagnosis Date Noted   B12 deficiency 09/26/2019   Hyperlipidemia 06/09/2019   Depression, recurrent (HCC) 04/29/2018   Primary insomnia 04/29/2018   Situational anxiety 04/29/2018    Past Medical History:  Diagnosis Date   Anemia    Depression    GERD (gastroesophageal reflux disease)    Heart murmur    Post-operative nausea and vomiting     Past Surgical History:  Procedure Laterality Date   ABDOMINAL HYSTERECTOMY  2005   COLONOSCOPY  11/08/2018   GYNECOLOGIC CRYOSURGERY     MYOMECTOMY     SALPINGOOPHORECTOMY     both at 2 different surgeries   UPPER GASTROINTESTINAL ENDOSCOPY  11/08/2018   WISDOM TOOTH EXTRACTION      Social History   Socioeconomic History   Marital status: Widowed    Spouse name: Montana Bryngelson    Number of children: 1   Years of education: Not on file   Highest education level: Not on file  Occupational History   Occupation: Retired  Tobacco Use   Smoking status: Former    Current packs/day: 0.00    Types: Cigarettes    Quit date: 02/07/1998    Years since quitting: 25.4   Smokeless tobacco: Never  Vaping Use   Vaping status: Never Used  Substance and Sexual Activity   Alcohol use: Yes    Alcohol/week: 1.0 - 2.0 standard drink of alcohol    Types: 1 - 2 Glasses of wine per week  Comment: occasionally   Drug use: Never   Sexual activity: Yes    Birth control/protection: Post-menopausal  Other Topics Concern   Not on file  Social History Narrative   Not on file   Social Drivers of Health   Financial Resource Strain: Not on file  Food Insecurity: Not on file  Transportation Needs: Not on file  Physical Activity: Not on file  Stress: Not on file  Social Connections: Not on file  Intimate Partner Violence: Not on file    Family History  Problem Relation Age of Onset   Cancer Mother    COPD Mother    Early death Mother    Heart disease Mother    Miscarriages / [place] Mother    Alcohol abuse Father     Depression Father    Alcohol abuse Brother    Early death Brother    Heart attack Brother    Hypertension Brother    Uterine cancer Paternal Grandmother    Early death Paternal Grandfather    Depression Daughter    Alcohol abuse Brother    Depression Brother    Heart disease Brother    Colon cancer Neg Hx    Pancreatic cancer Neg Hx    Rectal cancer Neg Hx    Stomach cancer Neg Hx    Esophageal cancer Neg Hx    Colon polyps Neg Hx      Review of Systems  Constitutional: Negative.  Negative for chills and fever.  HENT:  Negative for congestion and sore throat.   Respiratory: Negative.  Negative for cough and shortness of breath.   Cardiovascular: Negative.  Negative for chest pain and palpitations.  Gastrointestinal:  Negative for abdominal pain, diarrhea, nausea and vomiting.  Genitourinary: Negative.  Negative for dysuria and hematuria.  Skin: Negative.  Negative for rash.  Neurological: Negative.  Negative for dizziness and headaches.  All other systems reviewed and are negative.   Vitals:   06/28/23 1023  BP: 122/78  Pulse: 64  Temp: 98.6 F (37 C)  SpO2: 96%    Physical Exam Vitals reviewed.  Constitutional:      Appearance: Normal appearance.  HENT:     Head: Normocephalic.  Eyes:     Extraocular Movements: Extraocular movements intact.  Cardiovascular:     Rate and Rhythm: Normal rate and regular rhythm.     Heart sounds: Normal heart sounds.  Pulmonary:     Effort: Pulmonary effort is normal.     Breath sounds: Normal breath sounds.  Skin:    General: Skin is warm and dry.  Neurological:     Mental Status: She is alert and oriented to person, place, and time.  Psychiatric:        Mood and Affect: Mood normal.        Behavior: Behavior normal.      ASSESSMENT & PLAN: A total of 30 minutes was spent with the patient and counseling/coordination of care regarding preparing for this visit, review of most recent office visit notes, review of most  recent blood work results, review of all medications, review of chronic medical conditions under management, education on nutrition, mental health, prognosis, documentation and need for follow-up for general annual exam.  Problem List Items Addressed This Visit       Other   Depression, recurrent (HCC) - Primary   Clinically stable.  Mental health in good shape. Continue Wellbutrin 300 mg daily Follow-up for general annual physical exam sometime this year at your  convenience.      Relevant Medications   buPROPion (WELLBUTRIN XL) 300 MG 24 hr tablet   HSV-2 (herpes simplex virus 2) infection   Taking valacyclovir 500 mg daily for prophylaxis No recent flareups      Relevant Medications   valACYclovir (VALTREX) 500 MG tablet   Other Visit Diagnoses       Encounter for medication refill          Patient Instructions  Health Maintenance, Female Adopting a healthy lifestyle and getting preventive care are important in promoting health and wellness. Ask your health care provider about: The right schedule for you to have regular tests and exams. Things you can do on your own to prevent diseases and keep yourself healthy. What should I know about diet, weight, and exercise? Eat a healthy diet  Eat a diet that includes plenty of vegetables, fruits, low-fat dairy products, and lean protein. Do not eat a lot of foods that are high in solid fats, added sugars, or sodium. Maintain a healthy weight Body mass index (BMI) is used to identify weight problems. It estimates body fat based on height and weight. Your health care provider can help determine your BMI and help you achieve or maintain a healthy weight. Get regular exercise Get regular exercise. This is one of the most important things you can do for your health. Most adults should: Exercise for at least 150 minutes each week. The exercise should increase your heart rate and make you sweat (moderate-intensity exercise). Do  strengthening exercises at least twice a week. This is in addition to the moderate-intensity exercise. Spend less time sitting. Even light physical activity can be beneficial. Watch cholesterol and blood lipids Have your blood tested for lipids and cholesterol at 65 years of age, then have this test every 5 years. Have your cholesterol levels checked more often if: Your lipid or cholesterol levels are high. You are older than 65 years of age. You are at high risk for heart disease. What should I know about cancer screening? Depending on your health history and family history, you may need to have cancer screening at various ages. This may include screening for: Breast cancer. Cervical cancer. Colorectal cancer. Skin cancer. Lung cancer. What should I know about heart disease, diabetes, and high blood pressure? Blood pressure and heart disease High blood pressure causes heart disease and increases the risk of stroke. This is more likely to develop in people who have high blood pressure readings or are overweight. Have your blood pressure checked: Every 3-5 years if you are 34-7 years of age. Every year if you are 59 years old or older. Diabetes Have regular diabetes screenings. This checks your fasting blood sugar level. Have the screening done: Once every three years after age 17 if you are at a normal weight and have a low risk for diabetes. More often and at a younger age if you are overweight or have a high risk for diabetes. What should I know about preventing infection? Hepatitis B If you have a higher risk for hepatitis B, you should be screened for this virus. Talk with your health care provider to find out if you are at risk for hepatitis B infection. Hepatitis C Testing is recommended for: Everyone born from [birth year] through 1967. Anyone with known risk factors for hepatitis C. Sexually transmitted infections (STIs) Get screened for STIs, including gonorrhea and chlamydia,  if: You are sexually active and are younger than 65 years of age. You are  older than 65 years of age and your health care provider tells you that you are at risk for this type of infection. Your sexual activity has changed since you were last screened, and you are at increased risk for chlamydia or gonorrhea. Ask your health care provider if you are at risk. Ask your health care provider about whether you are at high risk for HIV. Your health care provider may recommend a prescription medicine to help prevent HIV infection. If you choose to take medicine to prevent HIV, you should first get tested for HIV. You should then be tested every 3 months for as long as you are taking the medicine. Pregnancy If you are about to stop having your period (premenopausal) and you may become pregnant, seek counseling before you get pregnant. Take 400 to 800 micrograms (mcg) of folic acid every day if you become pregnant. Ask for birth control (contraception) if you want to prevent pregnancy. Osteoporosis and menopause Osteoporosis is a disease in which the bones lose minerals and strength with aging. This can result in bone fractures. If you are 36 years old or older, or if you are at risk for osteoporosis and fractures, ask your health care provider if you should: Be screened for bone loss. Take a calcium or vitamin D supplement to lower your risk of fractures. Be given hormone replacement therapy (HRT) to treat symptoms of menopause. Follow these instructions at home: Alcohol use Do not drink alcohol if: Your health care provider tells you not to drink. You are pregnant, may be pregnant, or are planning to become pregnant. If you drink alcohol: Limit how much you have to: 0-1 drink a day. Know how much alcohol is in your drink. In the [place] one drink equals one 12 oz bottle of beer (355 mL), one 5 oz glass of wine (148 mL), or one 1 oz glass of hard liquor (44 mL). Lifestyle Do not use any products that  contain nicotine or tobacco. These products include cigarettes, chewing tobacco, and vaping devices, such as e-cigarettes. If you need help quitting, ask your health care provider. Do not use street drugs. Do not share needles. Ask your health care provider for help if you need support or information about quitting drugs. General instructions Schedule regular health, dental, and eye exams. Stay current with your vaccines. Tell your health care provider if: You often feel depressed. You have ever been abused or do not feel safe at home. Summary Adopting a healthy lifestyle and getting preventive care are important in promoting health and wellness. Follow your health care provider's instructions about healthy diet, exercising, and getting tested or screened for diseases. Follow your health care provider's instructions on monitoring your cholesterol and blood pressure. This information is not intended to replace advice given to you by your health care provider. Make sure you discuss any questions you have with your health care provider. Document Revised: 08/30/2020 Document Reviewed: 08/30/2020 Elsevier Patient Education  2024 [employer]     Edwina Barth, MD [facility]

## 2023-07-12 DIAGNOSIS — J069 Acute upper respiratory infection, unspecified: Secondary | ICD-10-CM | POA: Diagnosis not present

## 2023-07-12 DIAGNOSIS — R051 Acute cough: Secondary | ICD-10-CM | POA: Diagnosis not present

## 2023-07-12 DIAGNOSIS — G4483 Primary cough headache: Secondary | ICD-10-CM | POA: Diagnosis not present

## 2023-07-12 DIAGNOSIS — H66002 Acute suppurative otitis media without spontaneous rupture of ear drum, left ear: Secondary | ICD-10-CM | POA: Diagnosis not present

## 2023-07-12 DIAGNOSIS — Z6826 Body mass index (BMI) 26.0-26.9, adult: Secondary | ICD-10-CM | POA: Diagnosis not present

## 2023-07-17 ENCOUNTER — Other Ambulatory Visit: Payer: Self-pay

## 2023-07-17 DIAGNOSIS — Z5181 Encounter for therapeutic drug level monitoring: Secondary | ICD-10-CM

## 2023-07-17 MED ORDER — ESTRADIOL 0.5 MG PO TABS: 0.5000 mg | ORAL_TABLET | Freq: Every day | ORAL | 0 refills | Status: DC

## 2023-07-17 NOTE — Telephone Encounter (Signed)
 Med refill request: Estradiol 0.5 mg tablet Last AEX: 07/18/2022 JJ Next AEX:not scheduled, sent message to front desk to schedule patient for annual visit Last MMG (if hormonal med) 01/25/2023 Refill authorized: Last Rx sent #[phone] with 3 refills on 07/18/2022. Please approve or deny as appropriate.

## 2023-07-19 ENCOUNTER — Ambulatory Visit: Payer: 59 | Admitting: Sports Medicine

## 2023-07-26 ENCOUNTER — Ambulatory Visit (INDEPENDENT_AMBULATORY_CARE_PROVIDER_SITE_OTHER): Admitting: Emergency Medicine

## 2023-07-26 ENCOUNTER — Encounter: Payer: Self-pay | Admitting: Emergency Medicine

## 2023-07-26 VITALS — BP 122/74 | HR 60 | Temp 98.5°F | Ht 67.5 in | Wt 171.0 lb

## 2023-07-26 DIAGNOSIS — Z0000 Encounter for general adult medical examination without abnormal findings: Secondary | ICD-10-CM | POA: Diagnosis not present

## 2023-07-26 DIAGNOSIS — Z130 Encounter for screening for diseases of the blood and blood-forming organs and certain disorders involving the immune mechanism: Secondary | ICD-10-CM

## 2023-07-26 DIAGNOSIS — Z13220 Encounter for screening for lipoid disorders: Secondary | ICD-10-CM

## 2023-07-26 DIAGNOSIS — Z13228 Encounter for screening for other metabolic disorders: Secondary | ICD-10-CM

## 2023-07-26 DIAGNOSIS — Z1329 Encounter for screening for other suspected endocrine disorder: Secondary | ICD-10-CM | POA: Diagnosis not present

## 2023-07-26 LAB — COMPREHENSIVE METABOLIC PANEL WITH GFR
ALT: 38 U/L — ABNORMAL HIGH (ref 0–35)
AST: 39 U/L — ABNORMAL HIGH (ref 0–37)
Albumin: 4.4 g/dL (ref 3.5–5.2)
Alkaline Phosphatase: 47 U/L (ref 39–117)
BUN: 19 mg/dL (ref 6–23)
CO2: 31 meq/L (ref 19–32)
Calcium: 9.5 mg/dL (ref 8.4–10.5)
Chloride: 102 meq/L (ref 96–112)
Creatinine, Ser: 0.82 mg/dL (ref 0.40–1.20)
GFR: 75.59 mL/min (ref >60.00)
Glucose, Bld: 100 mg/dL — ABNORMAL HIGH (ref 70–99)
Potassium: 4.2 meq/L (ref 3.5–5.1)
Sodium: 139 meq/L (ref 135–145)
Total Bilirubin: 0.3 mg/dL (ref 0.2–1.2)
Total Protein: 6.6 g/dL (ref 6.0–8.3)

## 2023-07-26 LAB — CBC WITH DIFFERENTIAL/PLATELET
Basophils Absolute: 0 10*3/uL (ref 0.0–0.1)
Basophils Relative: 0.8 % (ref 0.0–3.0)
Eosinophils Absolute: 0.1 10*3/uL (ref 0.0–0.7)
Eosinophils Relative: 1.3 % (ref 0.0–5.0)
HCT: 37.7 % (ref 36.0–46.0)
Hemoglobin: 12.9 g/dL (ref 12.0–15.0)
Lymphocytes Relative: 32.3 % (ref 12.0–46.0)
Lymphs Abs: 1.7 10*3/uL (ref 0.7–4.0)
MCHC: 34.4 g/dL (ref 30.0–36.0)
MCV: 99.1 fl (ref 78.0–100.0)
Monocytes Absolute: 0.4 10*3/uL (ref 0.1–1.0)
Monocytes Relative: 6.9 % (ref 3.0–12.0)
Neutro Abs: 3.1 10*3/uL (ref 1.4–7.7)
Neutrophils Relative %: 58.7 % (ref 43.0–77.0)
Platelets: 284 10*3/uL (ref 150.0–400.0)
RBC: 3.8 Mil/uL — ABNORMAL LOW (ref 3.87–5.11)
RDW: 13.2 % (ref 11.5–15.5)
WBC: 5.3 10*3/uL (ref 4.0–10.5)

## 2023-07-26 LAB — LIPID PANEL
Cholesterol: 214 mg/dL — ABNORMAL HIGH (ref 0–200)
HDL: 70.6 mg/dL (ref >39.00)
LDL Cholesterol: 126 mg/dL — ABNORMAL HIGH (ref 0–99)
NonHDL: 143.34
Total CHOL/HDL Ratio: 3
Triglycerides: 85 mg/dL (ref 0.0–149.0)
VLDL: 17 mg/dL (ref 0.0–40.0)

## 2023-07-26 LAB — TSH: TSH: 0.43 u[IU]/mL (ref 0.35–5.50)

## 2023-07-26 LAB — VITAMIN D 25 HYDROXY (VIT D DEFICIENCY, FRACTURES): VITD: 48.05 ng/mL (ref 30.00–100.00)

## 2023-07-26 LAB — VITAMIN B12: Vitamin B-12: >1537 pg/mL — ABNORMAL HIGH (ref 211–911)

## 2023-07-26 NOTE — Progress Notes (Signed)
 Julie Hopkins 63 y.o.   Chief Complaint  Patient presents with   Annual Exam    Patient here for physical and mentions she's been feeling really tired these past couple of days.     HISTORY OF PRESENT ILLNESS: This is a 63 y.o. female here for annual exam. Has been feeling fatigued for the last several days No other complaints or medical concerns today.  HPI   Prior to Admission medications   Medication Sig Start Date End Date Taking? Authorizing Provider  buPROPion (WELLBUTRIN XL) 300 MG 24 hr tablet Take 1 tablet (300 mg total) by mouth daily. 06/28/23 06/22/24 Yes Rylen Hou, Eilleen Kempf, MD  Calcium-Phosphorus-Vitamin D (CALCIUM/VITAMIN D3/ADULT GUMMY) 250-100-500 MG-MG-UNIT CHEW Chew 1 Units by mouth daily.   Yes [provider]  estradiol (ESTRACE) 0.5 MG tablet Take 1 tablet (0.5 mg total) by mouth daily. 07/17/23  Yes Chrzanowski, Jami B, NP  ibuprofen (ADVIL) 600 MG tablet Take 600 mg by mouth every 8 (eight) hours as needed for mild pain. 06/09/19  Yes [provider]  loratadine (CLARITIN) 10 MG tablet Take 10 mg by mouth daily.   Yes [provider]  Multiple Vitamin (MULTIVITAMIN) tablet Take 1 tablet by mouth daily.   Yes [provider]  rosuvastatin (CRESTOR) 5 MG tablet TAKE 1 TABLET BY MOUTH EVERY DAY 06/05/23  Yes Jake Bathe, MD  valACYclovir (VALTREX) 500 MG tablet TAKE ONE TABLET BY MOUTH DAILY, INCREASE TO ONE TABLET TWICE A DAY for 3 days prn OUTBREAK 06/28/23  Yes Gearld Kerstein, Eilleen Kempf, MD  vitamin B-12 (CYANOCOBALAMIN) 500 MCG tablet Take by mouth.   Yes [provider]    Allergies  Allergen Reactions   Morphine Sulfate Itching   Sulfa Antibiotics Palpitations    Pt's mother said she was always allergic to it. Never taken med.     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   B12 deficiency 09/26/2019   Hyperlipidemia 06/09/2019   Depression,  recurrent (HCC) 04/29/2018   Primary insomnia 04/29/2018   Situational anxiety 04/29/2018    Past Medical History:  Diagnosis Date   Anemia    Depression    GERD (gastroesophageal reflux disease)    Heart murmur    Post-operative nausea and vomiting     Past Surgical History:  Procedure Laterality Date   ABDOMINAL HYSTERECTOMY  2005   COLONOSCOPY  11/08/2018   GYNECOLOGIC CRYOSURGERY     MYOMECTOMY     SALPINGOOPHORECTOMY     both at 2 different surgeries   UPPER GASTROINTESTINAL ENDOSCOPY  11/08/2018   WISDOM TOOTH EXTRACTION      Social History   Socioeconomic History   Marital status: Widowed    Spouse name: Karyna Bessler    Number of children: 1   Years of education: Not on file   Highest education level: Not on file  Occupational History   Occupation: Retired  Tobacco Use   Smoking status: Former    Current packs/day: 0.00    Types: Cigarettes    Quit date: 02/07/1998    Years since quitting: 25.4   Smokeless tobacco: Never  Vaping Use   Vaping status: Never Used  Substance and Sexual Activity   Alcohol use: Yes    Alcohol/week: 1.0 - 2.0 standard drink of alcohol    Types: 1 - 2 Glasses of wine per week    Comment: occasionally   Drug use: Never  Sexual activity: Yes    Birth control/protection: Post-menopausal  Other Topics Concern   Not on file  Social History Narrative   Not on file   Social Drivers of Health   Financial Resource Strain: Not on file  Food Insecurity: Not on file  Transportation Needs: Not on file  Physical Activity: Not on file  Stress: Not on file  Social Connections: Not on file  Intimate Partner Violence: Not on file    Family History  Problem Relation Age of Onset   Cancer Mother    COPD Mother    Early death Mother    Heart disease Mother    Miscarriages / [place] Mother    Alcohol abuse Father    Depression Father    Alcohol abuse Brother    Early death Brother    Heart attack Brother     Hypertension Brother    Uterine cancer Paternal Grandmother    Early death Paternal Grandfather    Depression Daughter    Alcohol abuse Brother    Depression Brother    Heart disease Brother    Colon cancer Neg Hx    Pancreatic cancer Neg Hx    Rectal cancer Neg Hx    Stomach cancer Neg Hx    Esophageal cancer Neg Hx    Colon polyps Neg Hx      Review of Systems  Constitutional:  Positive for malaise/fatigue. Negative for chills, fever and weight loss.  HENT: Negative.  Negative for congestion and sore throat.   Respiratory: Negative.  Negative for cough and shortness of breath.   Cardiovascular: Negative.  Negative for chest pain and palpitations.  Gastrointestinal:  Negative for abdominal pain, diarrhea, nausea and vomiting.  Genitourinary: Negative.  Negative for dysuria and hematuria.  Skin: Negative.  Negative for rash.  Neurological: Negative.  Negative for dizziness and headaches.  All other systems reviewed and are negative.   Vitals:   07/26/23 1537  BP: 122/74  Pulse: 60  Temp: 98.5 F (36.9 C)  SpO2: 100%    Physical Exam Vitals reviewed.  Constitutional:      Appearance: Normal appearance.  HENT:     Head: Normocephalic.     Right Ear: Tympanic membrane, ear canal and external ear normal.     Left Ear: Tympanic membrane, ear canal and external ear normal.     Mouth/Throat:     Mouth: Mucous membranes are moist.     Pharynx: Oropharynx is clear.  Eyes:     Extraocular Movements: Extraocular movements intact.     Conjunctiva/sclera: Conjunctivae normal.     Pupils: Pupils are equal, round, and reactive to light.  Cardiovascular:     Rate and Rhythm: Normal rate and regular rhythm.     Pulses: Normal pulses.     Heart sounds: Normal heart sounds.  Pulmonary:     Effort: Pulmonary effort is normal.     Breath sounds: Normal breath sounds.  Abdominal:     Palpations: Abdomen is soft.     Tenderness: There is no abdominal tenderness.   Musculoskeletal:     Cervical back: No tenderness.  Lymphadenopathy:     Cervical: No cervical adenopathy.  Skin:    General: Skin is warm and dry.     Capillary Refill: Capillary refill takes less than 2 seconds.  Neurological:     General: No focal deficit present.     Mental Status: She is alert and oriented to person, place, and time.  Psychiatric:  Mood and Affect: Mood normal.        Behavior: Behavior normal.      ASSESSMENT & PLAN: Problem List Items Addressed This Visit   None Visit Diagnoses       Routine general medical examination at a health care facility    -  Primary   Relevant Orders   CBC with Differential/Platelet   Comprehensive metabolic panel with GFR   Hemoglobin A1c   Lipid panel   TSH   Vitamin B12   VITAMIN D 25 Hydroxy (Vit-D Deficiency, Fractures)     Screening for deficiency anemia       Relevant Orders   CBC with Differential/Platelet     Screening for lipoid disorders       Relevant Orders   Lipid panel     Screening for endocrine, metabolic and immunity disorder       Relevant Orders   Comprehensive metabolic panel with GFR   Hemoglobin A1c   TSH   Vitamin B12   VITAMIN D 25 Hydroxy (Vit-D Deficiency, Fractures)      Modifiable risk factors discussed with patient. Anticipatory guidance according to age provided. The following topics were also discussed: Social Determinants of Health Smoking.  Non-smoker Diet and nutrition Benefits of exercise Cancer screening andreview of most recent mammogram and colonoscopy reports Vaccinations review and recommendations Cardiovascular risk assessment Mental health including depression and anxiety Fall and accident prevention  Patient Instructions  Health Maintenance, Female Adopting a healthy lifestyle and getting preventive care are important in promoting health and wellness. Ask your health care provider about: The right schedule for you to have regular tests and exams. Things  you can do on your own to prevent diseases and keep yourself healthy. What should I know about diet, weight, and exercise? Eat a healthy diet  Eat a diet that includes plenty of vegetables, fruits, low-fat dairy products, and lean protein. Do not eat a lot of foods that are high in solid fats, added sugars, or sodium. Maintain a healthy weight Body mass index (BMI) is used to identify weight problems. It estimates body fat based on height and weight. Your health care provider can help determine your BMI and help you achieve or maintain a healthy weight. Get regular exercise Get regular exercise. This is one of the most important things you can do for your health. Most adults should: Exercise for at least 150 minutes each week. The exercise should increase your heart rate and make you sweat (moderate-intensity exercise). Do strengthening exercises at least twice a week. This is in addition to the moderate-intensity exercise. Spend less time sitting. Even light physical activity can be beneficial. Watch cholesterol and blood lipids Have your blood tested for lipids and cholesterol at 65 years of age, then have this test every 5 years. Have your cholesterol levels checked more often if: Your lipid or cholesterol levels are high. You are older than 65 years of age. You are at high risk for heart disease. What should I know about cancer screening? Depending on your health history and family history, you may need to have cancer screening at various ages. This may include screening for: Breast cancer. Cervical cancer. Colorectal cancer. Skin cancer. Lung cancer. What should I know about heart disease, diabetes, and high blood pressure? Blood pressure and heart disease High blood pressure causes heart disease and increases the risk of stroke. This is more likely to develop in people who have high blood pressure readings or are overweight. Have  your blood pressure checked: Every 3-5 years if you  are 85-2 years of age. Every year if you are 14 years old or older. Diabetes Have regular diabetes screenings. This checks your fasting blood sugar level. Have the screening done: Once every three years after age 74 if you are at a normal weight and have a low risk for diabetes. More often and at a younger age if you are overweight or have a high risk for diabetes. What should I know about preventing infection? Hepatitis B If you have a higher risk for hepatitis B, you should be screened for this virus. Talk with your health care provider to find out if you are at risk for hepatitis B infection. Hepatitis C Testing is recommended for: Everyone born from [birth year] through 1967. Anyone with known risk factors for hepatitis C. Sexually transmitted infections (STIs) Get screened for STIs, including gonorrhea and chlamydia, if: You are sexually active and are younger than 65 years of age. You are older than 65 years of age and your health care provider tells you that you are at risk for this type of infection. Your sexual activity has changed since you were last screened, and you are at increased risk for chlamydia or gonorrhea. Ask your health care provider if you are at risk. Ask your health care provider about whether you are at high risk for HIV. Your health care provider may recommend a prescription medicine to help prevent HIV infection. If you choose to take medicine to prevent HIV, you should first get tested for HIV. You should then be tested every 3 months for as long as you are taking the medicine. Pregnancy If you are about to stop having your period (premenopausal) and you may become pregnant, seek counseling before you get pregnant. Take 400 to 800 micrograms (mcg) of folic acid every day if you become pregnant. Ask for birth control (contraception) if you want to prevent pregnancy. Osteoporosis and menopause Osteoporosis is a disease in which the bones lose minerals and strength with  aging. This can result in bone fractures. If you are 79 years old or older, or if you are at risk for osteoporosis and fractures, ask your health care provider if you should: Be screened for bone loss. Take a calcium or vitamin D supplement to lower your risk of fractures. Be given hormone replacement therapy (HRT) to treat symptoms of menopause. Follow these instructions at home: Alcohol use Do not drink alcohol if: Your health care provider tells you not to drink. You are pregnant, may be pregnant, or are planning to become pregnant. If you drink alcohol: Limit how much you have to: 0-1 drink a day. Know how much alcohol is in your drink. In the [place] one drink equals one 12 oz bottle of beer (355 mL), one 5 oz glass of wine (148 mL), or one 1 oz glass of hard liquor (44 mL). Lifestyle Do not use any products that contain nicotine or tobacco. These products include cigarettes, chewing tobacco, and vaping devices, such as e-cigarettes. If you need help quitting, ask your health care provider. Do not use street drugs. Do not share needles. Ask your health care provider for help if you need support or information about quitting drugs. General instructions Schedule regular health, dental, and eye exams. Stay current with your vaccines. Tell your health care provider if: You often feel depressed. You have ever been abused or do not feel safe at home. Summary Adopting a healthy lifestyle and getting preventive  care are important in promoting health and wellness. Follow your health care provider's instructions about healthy diet, exercising, and getting tested or screened for diseases. Follow your health care provider's instructions on monitoring your cholesterol and blood pressure. This information is not intended to replace advice given to you by your health care provider. Make sure you discuss any questions you have with your health care provider. Document Revised: 08/30/2020 Document  Reviewed: 08/30/2020 Elsevier Patient Education  2024 Elsevier Inc.     Edwina Barth, MD [facility]

## 2023-07-26 NOTE — Patient Instructions (Signed)

## 2023-07-27 ENCOUNTER — Encounter: Payer: Self-pay | Admitting: Emergency Medicine

## 2023-07-27 LAB — HEMOGLOBIN A1C: Hgb A1c MFr Bld: 5.5 % (ref 4.6–6.5)

## 2023-08-02 ENCOUNTER — Ambulatory Visit: Admitting: Sports Medicine

## 2023-08-02 VITALS — BP 130/53 | Ht 67.0 in | Wt 169.0 lb

## 2023-08-02 DIAGNOSIS — M79605 Pain in left leg: Secondary | ICD-10-CM

## 2023-08-02 NOTE — Progress Notes (Signed)
 PCP: Georgina Quint, MD  Chief Complaint: Follow-up greater trochanteric pain syndrome Subjective:   HPI: Patient is a 63 y.o. female here for follow-up of her greater trochanteric pain syndrome on the left side.  Patient has been doing the flexion and abduction strengthening exercises.  Patient notes that her pain has improved significantly and she is feeling much better.  Patient denies any pain with any usage and has been doing strength training 3 times a week as well.  Patient is able to ambulate without any pain.  No pain on the lateral aspect of the hip with sleeping.  No other concerns at this time.   Past Medical History:  Diagnosis Date   Anemia    Depression    GERD (gastroesophageal reflux disease)    Heart murmur    Post-operative nausea and vomiting     Current Outpatient Medications on File Prior to Visit  Medication Sig Dispense Refill   buPROPion (WELLBUTRIN XL) 300 MG 24 hr tablet Take 1 tablet (300 mg total) by mouth daily. 90 tablet 3   Calcium-Phosphorus-Vitamin D (CALCIUM/VITAMIN D3/ADULT GUMMY) 250-100-500 MG-MG-UNIT CHEW Chew 1 Units by mouth daily.     estradiol (ESTRACE) 0.5 MG tablet Take 1 tablet (0.5 mg total) by mouth daily. 90 tablet 0   ibuprofen (ADVIL) 600 MG tablet Take 600 mg by mouth every 8 (eight) hours as needed for mild pain.     loratadine (CLARITIN) 10 MG tablet Take 10 mg by mouth daily.     Multiple Vitamin (MULTIVITAMIN) tablet Take 1 tablet by mouth daily.     rosuvastatin (CRESTOR) 5 MG tablet TAKE 1 TABLET BY MOUTH EVERY DAY 30 tablet 0   valACYclovir (VALTREX) 500 MG tablet TAKE ONE TABLET BY MOUTH DAILY, INCREASE TO ONE TABLET TWICE A DAY for 3 days prn OUTBREAK 90 tablet 4   vitamin B-12 (CYANOCOBALAMIN) 500 MCG tablet Take by mouth.     No current facility-administered medications on file prior to visit.    Past Surgical History:  Procedure Laterality Date   ABDOMINAL HYSTERECTOMY  2005   COLONOSCOPY  11/08/2018    GYNECOLOGIC CRYOSURGERY     MYOMECTOMY     SALPINGOOPHORECTOMY     both at 2 different surgeries   UPPER GASTROINTESTINAL ENDOSCOPY  11/08/2018   WISDOM TOOTH EXTRACTION      Allergies  Allergen Reactions   Morphine Sulfate Itching   Sulfa Antibiotics Palpitations    Pt's mother said she was always allergic to it. Never taken med.     BP (!) 130/53   Ht 5\' 7"  (1.702 m)   Wt 169 lb (76.7 kg)   BMI 26.47 kg/m       No data to display              No data to display              Objective:  Physical Exam:  Gen: NAD, comfortable in exam room  Left hip: No tenderness to palpation of the greater trochanteric area.  Range of motion is full with hip abduction and flexion.  Strength is 5 out of 5 with hip flexion and hip abduction against resistance.  FADIR FABER negative   Assessment & Plan:  1. 1. Left leg pain (Primary) - Patient presenting with significant improvement of her greater trochanteric pain syndrome.  Discussed with patient that it would be wise to continue doing the hip abductor and flexion strengthening exercises to prevent any worsening or reoccurrence  of symptoms in the future. - Patient is able to continue doing any activities she would like.  Follow-up as needed.    Brenton Grills MD, PGY-4  Sports Medicine Fellow [facility]  I observed and examined the patient with the [facility] resident and agree with assessment and plan.  Note reviewed and modified by me. Sterling Big, MD

## 2023-08-22 ENCOUNTER — Ambulatory Visit (INDEPENDENT_AMBULATORY_CARE_PROVIDER_SITE_OTHER): Admitting: Obstetrics and Gynecology

## 2023-08-22 ENCOUNTER — Encounter: Payer: Self-pay | Admitting: Obstetrics and Gynecology

## 2023-08-22 VITALS — BP 112/64 | HR 60 | Ht 67.25 in | Wt 169.0 lb

## 2023-08-22 DIAGNOSIS — Z79890 Hormone replacement therapy (postmenopausal): Secondary | ICD-10-CM | POA: Diagnosis not present

## 2023-08-22 DIAGNOSIS — Z13820 Encounter for screening for osteoporosis: Secondary | ICD-10-CM

## 2023-08-22 DIAGNOSIS — R3915 Urgency of urination: Secondary | ICD-10-CM | POA: Diagnosis not present

## 2023-08-22 DIAGNOSIS — B009 Herpesviral infection, unspecified: Secondary | ICD-10-CM

## 2023-08-22 DIAGNOSIS — Z1331 Encounter for screening for depression: Secondary | ICD-10-CM

## 2023-08-22 DIAGNOSIS — Z01419 Encounter for gynecological examination (general) (routine) without abnormal findings: Secondary | ICD-10-CM | POA: Insufficient documentation

## 2023-08-22 DIAGNOSIS — Z5181 Encounter for therapeutic drug level monitoring: Secondary | ICD-10-CM | POA: Insufficient documentation

## 2023-08-22 MED ORDER — ESTRADIOL 0.5 MG PO TABS: 0.5000 mg | ORAL_TABLET | Freq: Every day | ORAL | 3 refills | Status: DC

## 2023-08-22 NOTE — Progress Notes (Signed)
 63 y.o. 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 Widowed x35yr.  Urinary urgency, unsure of emptying bladder, a little back pain.  Considering dating again.  Has questions about HSV transmission.  Report  Postmenopausal bleeding: none Pelvic discharge or pain: none Breast mass, nipple discharge or skin changes : none Last PAP:     Component Value Date/Time   DIAGPAP  03/04/2019 0952    - Negative for intraepithelial lesion or malignancy (NILM)   HPVHIGH Negative 03/04/2019 0952   ADEQPAP Satisfactory for evaluation. 03/04/2019 4098   Last mammogram: 01/25/2023 BI-RADS 1, density C Last DXA: Never Last colonoscopy:  11/08/18 polyp, needs f/u in 7 years.   Sexually active: No Exercising: Home exercise routine includes walking run, strength train, and bike.  Smoker: No  Flowsheet Row Office Visit from 08/22/2023 in [facility]  PHQ-2 Total Score 0       Flowsheet Row Office Visit from 05/16/2022 in [facility] [place]  PHQ-9 Total Score 0       GYN HISTORY: History of cervical dysplasia, prior to hysterectomy Prior TAH  OB History  Gravida Para Term Preterm AB Living  2 1 1  1 1   SAB IAB Ectopic Multiple Live Births  1    1    # Outcome Date GA Lbr Len/2nd Weight Sex Type Anes PTL Lv  2 SAB           1 Term      Vag-Spont   LIV    Past Medical History:  Diagnosis Date   Anemia    Depression    GERD (gastroesophageal reflux disease)    Heart murmur    Post-operative nausea and vomiting     Past Surgical History:  Procedure Laterality Date   ABDOMINAL HYSTERECTOMY  2005   COLONOSCOPY  11/08/2018   GYNECOLOGIC CRYOSURGERY     MYOMECTOMY     SALPINGOOPHORECTOMY     both at 2 different surgeries   UPPER GASTROINTESTINAL ENDOSCOPY  11/08/2018   WISDOM TOOTH EXTRACTION      Current  Outpatient Medications on File Prior to Visit  Medication Sig Dispense Refill   buPROPion  (WELLBUTRIN  XL) 300 MG 24 hr tablet Take 1 tablet (300 mg total) by mouth daily. 90 tablet 3   Calcium -Phosphorus-Vitamin D  (CALCIUM /VITAMIN D3/ADULT GUMMY) 250-100-500 MG-MG-UNIT CHEW Chew 1 Units by mouth daily.     ibuprofen (ADVIL) 600 MG tablet Take 600 mg by mouth every 8 (eight) hours as needed for mild pain.     loratadine (CLARITIN) 10 MG tablet Take 10 mg by mouth daily.     Multiple Vitamin (MULTIVITAMIN) tablet Take 1 tablet by mouth daily.     valACYclovir  (VALTREX ) 500 MG tablet TAKE ONE TABLET BY MOUTH DAILY, INCREASE TO ONE TABLET TWICE A DAY for 3 days prn OUTBREAK 90 tablet 4   No current facility-administered medications on file prior to visit.    Social History   Socioeconomic History   Marital status: Widowed    Spouse name: Brynnlee Withee    Number of children: 1   Years of education: Not on file   Highest education level: Not on file  Occupational History   Occupation: Retired  Tobacco Use   Smoking status: Former    Current packs/day: 0.00    Types: Cigarettes    Quit date: 02/07/1998  Years since quitting: 25.5   Smokeless tobacco: Never  Vaping Use   Vaping status: Never Used  Substance and Sexual Activity   Alcohol use: Yes    Alcohol/week: 1.0 - 2.0 standard drink of alcohol    Types: 1 - 2 Glasses of wine per week    Comment: occasionally   Drug use: Never   Sexual activity: Not Currently    Birth control/protection: Post-menopausal  Other Topics Concern   Not on file  Social History Narrative   Not on file   Social Drivers of Health   Financial Resource Strain: Not on file  Food Insecurity: Not on file  Transportation Needs: Not on file  Physical Activity: Not on file  Stress: Not on file  Social Connections: Not on file  Intimate Partner Violence: Not on file    Family History  Problem Relation Age of Onset   Cancer Mother    COPD Mother     Early death Mother    Heart disease Mother    Miscarriages / [place] Mother    Alcohol abuse Father    Depression Father    Alcohol abuse Brother    Early death Brother    Heart attack Brother    Hypertension Brother    Alcohol abuse Brother    Depression Brother    Heart disease Brother    Breast cancer Paternal Aunt    Uterine cancer Paternal Grandmother    Early death Paternal Grandfather    Depression Daughter    Colon cancer Neg Hx    Pancreatic cancer Neg Hx    Rectal cancer Neg Hx    Stomach cancer Neg Hx    Esophageal cancer Neg Hx    Colon polyps Neg Hx     Allergies  Allergen Reactions   Morphine Sulfate Itching   Sulfa Antibiotics Palpitations    Pt's mother said she was always allergic to it. Never taken med.     PE Today's Vitals   08/22/23 1535  BP: 112/64  Pulse: 60  SpO2: 98%  Weight: 169 lb (76.7 kg)  Height: 5' 7.25" (1.708 m)   Body mass index is 26.27 kg/m.  Physical Exam Vitals reviewed. Exam conducted with a chaperone present.  Constitutional:      General: She is not in acute distress.    Appearance: Normal appearance.  HENT:     Head: Normocephalic and atraumatic.     Nose: Nose normal.  Eyes:     Extraocular Movements: Extraocular movements intact.     Conjunctiva/sclera: Conjunctivae normal.  Neck:     Thyroid: No thyroid mass, thyromegaly or thyroid tenderness.  Pulmonary:     Effort: Pulmonary effort is normal.  Chest:     Chest wall: No mass or tenderness.  Breasts:    Right: Normal. No swelling, mass, nipple discharge or tenderness.     Left: Normal. No swelling, mass, nipple discharge or tenderness.  Abdominal:     General: There is no distension.     Palpations: Abdomen is soft.     Tenderness: There is no abdominal tenderness.  Genitourinary:    General: Normal vulva.     Exam position: Lithotomy position.     Urethra: No prolapse.     Vagina: Normal. No vaginal discharge or bleeding.     Cervix: No  lesion.     Adnexa: Right adnexa normal and left adnexa normal.     Comments: Cervix and uterus absent 4/5 Kegel Musculoskeletal:  General: Normal range of motion.     Cervical back: Normal range of motion.  Lymphadenopathy:     Upper Body:     Right upper body: No axillary adenopathy.     Left upper body: No axillary adenopathy.     Lower Body: No right inguinal adenopathy. No left inguinal adenopathy.  Skin:    General: Skin is warm and dry.  Neurological:     General: No focal deficit present.     Mental Status: She is alert.  Psychiatric:        Mood and Affect: Mood normal.        Behavior: Behavior normal.     Assessment and Plan:        Well woman exam with routine gynecological exam Assessment & Plan: Cervical cancer screening performed according to ASCCP guidelines. Encouraged annual mammogram screening Colonoscopy UTD DXA Due October Labs and immunizations with her primary Encouraged safe sexual practices as indicated Encouraged healthy lifestyle practices with diet and exercise For patients under 50-70yo, I recommend 1200mg  calcium  daily and 600IU of vitamin D  daily.    Urgency of urination -     Urinalysis,Complete w/RFL Culture  Encounter for monitoring postmenopausal estrogen replacement therapy Assessment & Plan: Patient would like to continue estradiol  at this time Reviewed risk of breast cancer, heart disease, stroke, DVT.  Warning signs reviewed with patient. Per ACC cal, CV risk  3.6% Will continue at this time  Orders: -     Estradiol ; Take 1 tablet (0.5 mg total) by mouth daily.  Dispense: 90 tablet; Refill: 3  Screening for osteoporosis -     DG Bone Density; Future  Negative depression screening  HSV-2 (herpes simplex virus 2) infection  Valtrex  Rx sent by PCP March 2025. Herpes education provided. Reviewed viral shedding is highest during outbreaks but can occur at any time. For patients with seronegative partners, I recommend  condoms use and continuous suppression. However, no therapy is indicated if partner is positive. All questions answered.    Romaine Closs, MD

## 2023-08-22 NOTE — Assessment & Plan Note (Addendum)
 Patient would like to continue estradiol  at this time Reviewed risk of breast cancer, heart disease, stroke, DVT.  Warning signs reviewed with patient. Per ACC cal, CV risk  3.6% Will continue at this time

## 2023-08-22 NOTE — Assessment & Plan Note (Signed)
 Cervical cancer screening performed according to ASCCP guidelines. Encouraged annual mammogram screening Colonoscopy UTD DXA Due October Labs and immunizations with her primary Encouraged safe sexual practices as indicated Encouraged healthy lifestyle practices with diet and exercise For patients under 50-65yo, I recommend 1200mg  calcium  daily and 600IU of vitamin D  daily.

## 2023-08-22 NOTE — Patient Instructions (Signed)

## 2023-08-25 LAB — URINE CULTURE
MICRO NUMBER:: 16394952
Result:: NO GROWTH
SPECIMEN QUALITY:: ADEQUATE

## 2023-08-25 LAB — URINALYSIS, COMPLETE W/RFL CULTURE
Bacteria, UA: NONE SEEN /HPF
Bilirubin Urine: NEGATIVE
Glucose, UA: NEGATIVE
Hyaline Cast: NONE SEEN /LPF
Ketones, ur: NEGATIVE
Leukocyte Esterase: NEGATIVE
Nitrites, Initial: NEGATIVE
Protein, ur: NEGATIVE
Specific Gravity, Urine: 1.009 (ref 1.001–1.035)
pH: 5.5 (ref 5.0–8.0)

## 2023-08-25 LAB — CULTURE INDICATED

## 2023-09-17 ENCOUNTER — Telehealth: Admitting: Physician Assistant

## 2023-09-17 DIAGNOSIS — L255 Unspecified contact dermatitis due to plants, except food: Secondary | ICD-10-CM

## 2023-09-17 MED ORDER — PREDNISONE 10 MG PO TABS: ORAL_TABLET | ORAL | 0 refills | Status: DC

## 2023-09-17 NOTE — Progress Notes (Signed)
E-Visit for Poison Ivy  We are sorry that you are not feeing well.  Here is how we plan to help!  Based on what you have shared with me it looks like you have had an allergic reaction to the oily resin from a group of plants.  This resin is very sticky, so it easily attaches to your skin, clothing, tools equipment, and pet's fur.    This blistering rash is often called poison ivy rash although it can come from contact with the leaves, stems and roots of poison ivy, poison oak and poison sumac.  The oily resin contains urushiol (u-ROO-she-ol) that produces a skin rash on exposed skin.  The severity of the rash depends on the amount of urushiol that gets on your skin.  A section of skin with more urushiol on it may develop a rash sooner.  The rash usually develops 12-48 hours after exposure and can last two to three weeks.  Your skin must come in direct contact with the plant's oil to be affected.  Blister fluid doesn't spread the rash.  However, if you come into contact with a piece of clothing or pet fur that has urushiol on it, the rash may spread out.  You can also transfer the oil to other parts of your body with your fingers.  Often the rash looks like a straight line because of the way the plant brushes against your skin.  Since your rash is widespread or has resulted in a large number of blisters, I have prescribed an oral corticosteroid.  Please follow these recommendations:  I have sent a prednisone dose pack to your chosen pharmacy. Be sure to follow the instructions carefully and complete the entire prescription. You may use Benadryl or Caladryl topical lotions to sooth the itch and remember cool, not hot, showers and baths can help relieve the itching!  Place cool, wet compresses on the affected area for 15-30 minutes several times a day.  You may also take oral antihistamines, such as diphenhydramine (Benadryl, others), which may also help you sleep better.  Watch your skin for any purulent  (pus) drainage or red streaking from the site.  If this occurs, contact your provider.  You may require an antibiotic for a skin infection.  Make sure that the clothes you were wearing as well as any towels or sheets that may have come in contact with the oil (urushiol) are washed in detergent and hot water.       I have developed the following plan to treat your condition I am prescribing a two week course of steroids (37 tablets of 10 mg prednisone).  Days 1-4 take 4 tablets (40 mg) daily  Days 5-8 take 3 tablets (30 mg) daily, Days 9-11 take 2 tablets (20 mg) daily, Days 12-14 take 1 tablet (10 mg) daily.    What can you do to prevent this rash?  Avoid the plants.  Learn how to identify poison ivy, poison oak and poison sumac in all seasons.  When hiking or engaging in other activities that might expose you to these plants, try to stay on cleared pathways.  If camping, make sure you pitch your tent in an area free of these plants.  Keep pets from running through wooded areas so that urushiol doesn't accidentally stick to their fur, which you may touch.  Remove or kill the plants.  In your yard, you can get rid of poison ivy by applying an herbicide or pulling it out of   the ground, including the roots, while wearing heavy gloves.  Afterward remove the gloves and thoroughly wash them and your hands.  Don't burn poison ivy or related plants because the urushiol can be carried by smoke.  Wear protective clothing.  If needed, protect your skin by wearing socks, boots, pants, long sleeves and vinyl gloves.  Wash your skin right away.  Washing off the oil with soap and water within 30 minutes of exposure may reduce your chances of getting a poison ivy rash.  Even washing after an hour or so can help reduce the severity of the rash.  If you walk through some poison ivy and then later touch your shoes, you may get some urushiol on your hands, which may then transfer to your face or body by touching or  rubbing.  If the contaminated object isn't cleaned, the urushiol on it can still cause a skin reaction years later.    Be careful not to reuse towels after you have washed your skin.  Also carefully wash clothing in detergent and hot water to remove all traces of the oil.  Handle contaminated clothing carefully so you don't transfer the urushiol to yourself, furniture, rugs or appliances.  Remember that pets can carry the oil on their fur and paws.  If you think your pet may be contaminated with urushiol, put on some long rubber gloves and give your pet a bath.  Finally, be careful not to burn these plants as the smoke can contain traces of the oil.  Inhaling the smoke may result in difficulty breathing. If that occurred you should see a physician as soon as possible.  See your doctor right away if:  The reaction is severe or widespread You inhaled the smoke from burning poison ivy and are having difficulty breathing Your skin continues to swell The rash affects your eyes, mouth or genitals Blisters are oozing pus You develop a fever greater than 100 F (37.8 C) The rash doesn't get better within a few weeks.  If you scratch the poison ivy rash, bacteria under your fingernails may cause the skin to become infected.  See your doctor if pus starts oozing from the blisters.  Treatment generally includes antibiotics.  Poison ivy treatments are usually limited to self-care methods.  And the rash typically goes away on its own in two to three weeks.     If the rash is widespread or results in a large number of blisters, your doctor may prescribe an oral corticosteroid, such as prednisone.  If a bacterial infection has developed at the rash site, your doctor may give you a prescription for an oral antibiotic.  MAKE SURE YOU  Understand these instructions. Will watch your condition. Will get help right away if you are not doing well or get worse.   Thank you for choosing an e-visit.  Your  e-visit answers were reviewed by a board certified advanced clinical practitioner to complete your personal care plan. Depending upon the condition, your plan could have included both over the counter or prescription medications.  Please review your pharmacy choice. Make sure the pharmacy is open so you can pick up prescription now. If there is a problem, you may contact your provider through MyChart messaging and have the prescription routed to another pharmacy.  Your safety is important to us. If you have drug allergies check your prescription carefully.   For the next 24 hours you can use MyChart to ask questions about today's visit, request a non-urgent   call back, or ask for a work or school excuse. You will get an email in the next two days asking about your experience. I hope that your e-visit has been valuable and will speed your recovery.   I have spent 5 minutes in review of e-visit questionnaire, review and updating patient chart, medical decision making and response to patient.   Elisama Thissen M Malone Admire, PA-C  

## 2023-10-09 ENCOUNTER — Telehealth: Admitting: Physician Assistant

## 2023-10-09 DIAGNOSIS — T7840XA Allergy, unspecified, initial encounter: Secondary | ICD-10-CM

## 2023-10-09 MED ORDER — PREDNISONE 10 MG (21) PO TBPK
ORAL_TABLET | ORAL | 0 refills | Status: AC
Start: 2023-10-09 — End: ?

## 2023-10-09 NOTE — Progress Notes (Signed)
 E Visit for Rash  We are sorry that you are not feeling well. Here is how we plan to help!  Based on what you have shared with me you are dealing with a localized allergic reaction. Please avoid further use of the triggering lotion. Keep skin clean and dry.  Cool compresses can be beneficial. I am adding on a very short course of a steroid since we do not want to apply prescription-strength steroid creams to the face. You may also benefit from an OTC antihistamine like Benadryl, keeping in mind it may make you drowsy.   HOME CARE:  Take cool showers and avoid direct sunlight. Apply cool compress or wet dressings. Take a bath in an oatmeal bath.  Sprinkle content of one Aveeno packet under running faucet with comfortably warm water.  Bathe for 15-20 minutes, 1-2 times daily.  Pat dry with a towel. Do not rub the rash. Use hydrocortisone cream. Take an antihistamine like Benadryl for widespread rashes that itch.  The adult dose of Benadryl is 25-50 mg by mouth 4 times daily. Caution:  This type of medication may cause sleepiness.  Do not drink alcohol, drive, or operate dangerous machinery while taking antihistamines.  Do not take these medications if you have prostate enlargement.  Read package instructions thoroughly on all medications that you take.  GET HELP RIGHT AWAY IF:  Symptoms don't go away after treatment. Severe itching that persists. If you rash spreads or swells. If you rash begins to smell. If it blisters and opens or develops a yellow-brown crust. You develop a fever. You have a sore throat. You become short of breath.  MAKE SURE YOU:  Understand these instructions. Will watch your condition. Will get help right away if you are not doing well or get worse.  Thank you for choosing an e-visit.  Your e-visit answers were reviewed by a board certified advanced clinical practitioner to complete your personal care plan. Depending upon the condition, your plan could have  included both over the counter or prescription medications.  Please review your pharmacy choice. Make sure the pharmacy is open so you can pick up prescription now. If there is a problem, you may contact your provider through [employer] and have the prescription routed to another pharmacy.  Your safety is important to us . If you have drug allergies check your prescription carefully.   For the next 24 hours you can use MyChart to ask questions about today's visit, request a non-urgent call back, or ask for a work or school excuse. You will get an email in the next two days asking about your experience. I hope that your e-visit has been valuable and will speed your recovery.

## 2023-10-09 NOTE — Progress Notes (Signed)
 I have spent 5 minutes in review of e-visit questionnaire, review and updating patient chart, medical decision making and response to patient.   Piedad Climes, PA-C

## 2024-01-03 DIAGNOSIS — Z23 Encounter for immunization: Secondary | ICD-10-CM | POA: Diagnosis not present

## 2024-01-10 ENCOUNTER — Other Ambulatory Visit: Payer: Self-pay | Admitting: Emergency Medicine

## 2024-01-10 DIAGNOSIS — Z1231 Encounter for screening mammogram for malignant neoplasm of breast: Secondary | ICD-10-CM

## 2024-02-14 ENCOUNTER — Ambulatory Visit
Admission: RE | Admit: 2024-02-14 | Discharge: 2024-02-14 | Disposition: A | Discharge status: Home / Self Care | Source: Ambulatory Visit | Attending: Emergency Medicine | Admitting: Emergency Medicine

## 2024-02-14 DIAGNOSIS — Z1231 Encounter for screening mammogram for malignant neoplasm of breast: Secondary | ICD-10-CM

## 2024-02-18 ENCOUNTER — Ambulatory Visit: Payer: Self-pay | Admitting: Emergency Medicine

## 2024-02-20 ENCOUNTER — Ambulatory Visit (INDEPENDENT_AMBULATORY_CARE_PROVIDER_SITE_OTHER): Payer: Self-pay | Admitting: Podiatry

## 2024-02-20 ENCOUNTER — Encounter: Payer: Self-pay | Admitting: Podiatry

## 2024-02-20 ENCOUNTER — Ambulatory Visit (INDEPENDENT_AMBULATORY_CARE_PROVIDER_SITE_OTHER)

## 2024-02-20 DIAGNOSIS — M7752 Other enthesopathy of left foot: Secondary | ICD-10-CM | POA: Diagnosis not present

## 2024-02-20 DIAGNOSIS — M2041 Other hammer toe(s) (acquired), right foot: Secondary | ICD-10-CM

## 2024-02-20 DIAGNOSIS — D2371 Other benign neoplasm of skin of right lower limb, including hip: Secondary | ICD-10-CM | POA: Diagnosis not present

## 2024-02-20 DIAGNOSIS — M7751 Other enthesopathy of right foot: Secondary | ICD-10-CM

## 2024-02-20 NOTE — Progress Notes (Signed)
  Subjective:  Patient ID: Julie Hopkins, female    DOB: 08/27/1960,   MRN: [id number]  Chief Complaint  Patient presents with   Toe Pain    R 2nd to pain rubbing on Great toe x 5 months.  Not diabetic.  No anti coag    63 y.o. female presents for concern of right second toe rubbing on the great toe.  Relates this has been ongoing for 5 months and been getting worse.  She denies any current treatments.  She is hoping for some options to make sure this does not get any worse.  She also relates a lesion on the bottom of her right foot that has been painful.  Would like that treated.. Denies any other pedal complaints. Denies n/v/f/c.   Past Medical History:  Diagnosis Date   Anemia    Depression    GERD (gastroesophageal reflux disease)    Heart murmur    Post-operative nausea and vomiting     Objective:  Physical Exam: Vascular: DP/PT pulses 2/4 bilateral. CFT <3 seconds. Normal hair growth on digits. No edema.  Skin. No lacerations or abrasions bilateral feet.  Hyperkeratotic core lesion noted plantar lateral right foot with disruption of skin lines noted. Musculoskeletal: MMT 5/5 bilateral lower extremities in DF, PF, Inversion and Eversion. Deceased ROM in DF of ankle joint.  Right second digit adducted and slightly hammered abutting the right hallux.  No hyperkeratotic lesions noted. Neurological: Sensation intact to light touch.   Assessment:   1. Hammertoe of right foot   2. Benign neoplasm of skin of right foot      Plan:  Patient was evaluated and treated and all questions answered. -X-rays reviewed.  No acute fractures or dislocations.  Hammering of digits 234 and 5.  Adduction noted of the second digit. -Educated on hammertoes and treatment options  -Discussed padding including toe caps and toe spacers -Discussed need for potential surgery if pain does not improved.  -Discussed benign skin lesions with patient and treatment options.  -Hyperkeratotic tissue  was debrided with chisel without incident.  -Applied salycylic acid treatment to area with dressing. Advised to remove bandaging tomorrow.  -Encouraged daily moisturizing -Discussed use of pumice stone -Advised good supportive shoes and inserts -Patient to return to office as needed or sooner if condition worsens.   Julie Hopkins, DPM

## 2024-02-25 ENCOUNTER — Other Ambulatory Visit: Payer: Self-pay

## 2024-02-25 ENCOUNTER — Other Ambulatory Visit: Payer: Self-pay | Admitting: Emergency Medicine

## 2024-02-25 DIAGNOSIS — B009 Herpesviral infection, unspecified: Secondary | ICD-10-CM

## 2024-02-25 DIAGNOSIS — Z5181 Encounter for therapeutic drug level monitoring: Secondary | ICD-10-CM

## 2024-02-25 DIAGNOSIS — F339 Major depressive disorder, recurrent, unspecified: Secondary | ICD-10-CM

## 2024-02-25 MED ORDER — ESTRADIOL 0.5 MG PO TABS: 0.5000 mg | ORAL_TABLET | Freq: Every day | ORAL | 1 refills | Status: AC

## 2024-02-25 NOTE — Telephone Encounter (Signed)
 Med refill request:   estradiol  (ESTRACE ) 0.5 MG tablet  Start:  08/22/23 Disp:  90 tablets Refills:  3  Last AEX:  08/22/2023 Next AEX:  Not yet scheduled  *Front desk notified to contact Pt to schedule annual.  Last MMG (if hormonal med):  02/14/24 Refill authorized? Please Advise.

## 2024-02-25 NOTE — Telephone Encounter (Unsigned)
 Copied from CRM [id number]. Topic: Clinical - Medication Refill >> Feb 25, 2024 12:25 PM Brittany M wrote: Medication: valACYclovir  (VALTREX ) 500 MG tablet ;  buPROPion  (WELLBUTRIN  XL) 300 MG 24 hr tablet  Has the patient contacted their pharmacy? Yes (Agent: If no, request that the patient contact the pharmacy for the refill. If patient does not wish to contact the pharmacy document the reason why and proceed with request.) (Agent: If yes, when and what did the pharmacy advise?)  This is the patient's preferred pharmacy:  [facility] - [place], [place] - [address] [place] [address] Phone: [phone] Fax: [phone]  Is this the correct pharmacy for this prescription? Yes If no, delete pharmacy and type the correct one.   Has the prescription been filled recently? Yes  Is the patient out of the medication? Yes  Has the patient been seen for an appointment in the last year OR does the patient have an upcoming appointment? Yes  Can we respond through MyChart? Yes  Agent: Please be advised that Rx refills may take up to 3 business days. We ask that you follow-up with your pharmacy.

## 2024-02-29 MED ORDER — BUPROPION HCL ER (XL) 300 MG PO TB24: 300.0000 mg | ORAL_TABLET | Freq: Every day | ORAL | 3 refills | Status: AC

## 2024-02-29 MED ORDER — VALACYCLOVIR HCL 500 MG PO TABS: ORAL_TABLET | ORAL | 4 refills | Status: AC

## 2024-04-10 ENCOUNTER — Ambulatory Visit: Admitting: Obstetrics and Gynecology

## 2024-04-10 ENCOUNTER — Encounter: Payer: Self-pay | Admitting: Obstetrics and Gynecology

## 2024-04-10 VITALS — BP 102/64 | HR 64 | Temp 97.7°F | Wt 160.0 lb

## 2024-04-10 DIAGNOSIS — Z113 Encounter for screening for infections with a predominantly sexual mode of transmission: Secondary | ICD-10-CM

## 2024-04-10 DIAGNOSIS — R32 Unspecified urinary incontinence: Secondary | ICD-10-CM

## 2024-04-10 LAB — URINALYSIS, COMPLETE W/RFL CULTURE
Bacteria, UA: NONE SEEN /HPF
Bilirubin Urine: NEGATIVE
Casts: NONE SEEN /LPF
Crystals: NONE SEEN /HPF
Glucose, UA: NEGATIVE
Hgb urine dipstick: NEGATIVE
Ketones, ur: NEGATIVE
Leukocyte Esterase: NEGATIVE
Nitrites, Initial: NEGATIVE
Protein, ur: NEGATIVE
RBC / HPF: NONE SEEN /HPF (ref 0–2)
Specific Gravity, Urine: 1.003 (ref 1.001–1.035)
WBC, UA: NONE SEEN /HPF (ref 0–5)
Yeast: NONE SEEN /HPF
pH: 5.5 (ref 5.0–8.0)

## 2024-04-10 LAB — NO CULTURE INDICATED

## 2024-04-10 NOTE — Progress Notes (Signed)
 63 y.o. 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 Widowed.  No LMP recorded. Patient has had a hysterectomy.   She reports reports pressure and leakage that comes out of nowhere. No urge sensation. She notices a urine odor. Symptoms for 5-6 months.  Previously with urge incontinence, improved by increasing water and limiting caffeine. No SUI  Desires STD testing, dating new partner, not yet sexually active, no symptoms   OB History  Gravida Para Term Preterm AB Living  2 1 1  1 1   SAB IAB Ectopic Multiple Live Births  1    1    # Outcome Date GA Lbr Len/2nd Weight Sex Type Anes PTL Lv  2 SAB           1 Term      Vag-Spont   LIV   Past Medical History:  Diagnosis Date   Anemia    Depression    GERD (gastroesophageal reflux disease)    Heart murmur    Post-operative nausea and vomiting    Past Surgical History:  Procedure Laterality Date   ABDOMINAL HYSTERECTOMY  2005   COLONOSCOPY  11/08/2018   GYNECOLOGIC CRYOSURGERY     MYOMECTOMY     SALPINGOOPHORECTOMY     both at 2 different surgeries   UPPER GASTROINTESTINAL ENDOSCOPY  11/08/2018   WISDOM TOOTH EXTRACTION     Medications Ordered Prior to Encounter[1] Allergies[2]    PE Today's Vitals   04/10/24 0934  BP: 102/64  Pulse: 64  Temp: 97.7 F (36.5 C)  TempSrc: Oral  SpO2: 99%  Weight: 160 lb (72.6 kg)   Body mass index is 24.87 kg/m.  Physical Exam Vitals reviewed.  Constitutional:      General: She is not in acute distress.    Appearance: Normal appearance.  HENT:     Head: Normocephalic and atraumatic.     Nose: Nose normal.  Eyes:     Extraocular Movements: Extraocular movements intact.     Conjunctiva/sclera: Conjunctivae normal.  Pulmonary:     Effort: Pulmonary effort is normal.  Musculoskeletal:        General: Normal range of motion.     Cervical back: Normal range  of motion.  Neurological:     General: No focal deficit present.     Mental Status: She is alert.  Psychiatric:        Mood and Affect: Mood normal.        Behavior: Behavior normal.      Assessment and Plan:        Urinary incontinence, unspecified type -     Urinalysis,Complete w/RFL Culture -     Ambulatory referral to Urology  Normal UA, normal kegel with annual exam, performs regular pelvic floor exercises Patient with OAB symptoms, possibly mixed incontinence. Education on cause provided. Discussed lifestyle modifications for management, including avoidance of fluid prior to bed and bladder irritants. Also discussed PFPT and medical managements (including use of myrbetriq, gemtesa (okay with HTN), trospium, oxybutynin). Patient desires urology referral for urodynamics.   Screen for STD (sexually transmitted disease) -     Hepatitis B surface antigen -     Hepatitis C antibody -     RPR W/RFLX TO RPR TITER, TREPONEMAL AB, SCREEN AND DIAGNOSIS -     C. trachomatis/N. gonorrhoeae RNA Declined HIV testing, performed in 2020 during marriage  Other orders -  REFLEXIVE URINE CULTURE    Vera LULLA Pa, MD     [1]  Current Outpatient Medications on File Prior to Visit  Medication Sig Dispense Refill   buPROPion  (WELLBUTRIN  XL) 300 MG 24 hr tablet Take 1 tablet (300 mg total) by mouth daily. 90 tablet 3   Calcium -Phosphorus-Vitamin D  (CALCIUM /VITAMIN D3/ADULT GUMMY) 250-100-500 MG-MG-UNIT CHEW Chew 1 Units by mouth daily.     estradiol  (ESTRACE ) 0.5 MG tablet Take 1 tablet (0.5 mg total) by mouth daily. 90 tablet 1   ibuprofen (ADVIL) 600 MG tablet Take 600 mg by mouth every 8 (eight) hours as needed for mild pain.     loratadine (CLARITIN) 10 MG tablet Take 10 mg by mouth daily.     Multiple Vitamin (MULTIVITAMIN) tablet Take 1 tablet by mouth daily.     valACYclovir  (VALTREX ) 500 MG tablet TAKE ONE TABLET BY MOUTH DAILY, INCREASE TO ONE TABLET TWICE A DAY for 3 days prn  OUTBREAK 90 tablet 4   No current facility-administered medications on file prior to visit.  [2]  Allergies Allergen Reactions   Morphine Sulfate Itching   Sulfa Antibiotics Palpitations    Pt's mother said she was always allergic to it. Never taken med.

## 2024-04-11 ENCOUNTER — Ambulatory Visit: Payer: Self-pay | Admitting: Obstetrics and Gynecology

## 2024-04-11 LAB — C. TRACHOMATIS/N. GONORRHOEAE RNA
C. trachomatis RNA, TMA: NOT DETECTED
N. gonorrhoeae RNA, TMA: NOT DETECTED

## 2024-04-12 LAB — SYPHILIS: RPR W/REFLEX TO RPR TITER AND TREPONEMAL ANTIBODIES, TRADITIONAL SCREENING AND DIAGNOSIS ALGORITHM: RPR Ser Ql: NONREACTIVE

## 2024-04-12 LAB — HEPATITIS B SURFACE ANTIGEN: Hepatitis B Surface Ag: NONREACTIVE

## 2024-04-12 LAB — HEPATITIS C ANTIBODY: Hepatitis C Ab: NONREACTIVE

## 2024-05-14 ENCOUNTER — Ambulatory Visit: Payer: Self-pay | Admitting: Obstetrics and Gynecology

## 2024-05-14 ENCOUNTER — Ambulatory Visit

## 2024-05-14 ENCOUNTER — Other Ambulatory Visit: Payer: Self-pay | Admitting: Obstetrics and Gynecology

## 2024-05-14 ENCOUNTER — Encounter: Payer: Self-pay | Admitting: Obstetrics and Gynecology

## 2024-05-14 DIAGNOSIS — Z13820 Encounter for screening for osteoporosis: Secondary | ICD-10-CM

## 2024-05-14 DIAGNOSIS — Z780 Asymptomatic menopausal state: Secondary | ICD-10-CM

## 2024-05-14 DIAGNOSIS — Z1331 Encounter for screening for depression: Secondary | ICD-10-CM

## 2024-05-14 DIAGNOSIS — R3915 Urgency of urination: Secondary | ICD-10-CM

## 2024-05-14 DIAGNOSIS — Z5181 Encounter for therapeutic drug level monitoring: Secondary | ICD-10-CM

## 2024-05-14 DIAGNOSIS — Z01419 Encounter for gynecological examination (general) (routine) without abnormal findings: Secondary | ICD-10-CM

## 2024-05-14 DIAGNOSIS — B009 Herpesviral infection, unspecified: Secondary | ICD-10-CM

## 2024-05-19 ENCOUNTER — Ambulatory Visit: Admitting: Emergency Medicine

## 2024-05-22 ENCOUNTER — Ambulatory Visit: Admitting: Emergency Medicine

## 2024-05-22 ENCOUNTER — Encounter: Payer: Self-pay | Admitting: Emergency Medicine

## 2024-05-22 VITALS — BP 122/84 | HR 89 | Temp 98.1°F | Ht 67.25 in | Wt 161.0 lb

## 2024-05-22 DIAGNOSIS — Z0000 Encounter for general adult medical examination without abnormal findings: Secondary | ICD-10-CM | POA: Diagnosis not present

## 2024-05-22 DIAGNOSIS — Z13228 Encounter for screening for other metabolic disorders: Secondary | ICD-10-CM | POA: Diagnosis not present

## 2024-05-22 DIAGNOSIS — Z13220 Encounter for screening for lipoid disorders: Secondary | ICD-10-CM

## 2024-05-22 DIAGNOSIS — Z1329 Encounter for screening for other suspected endocrine disorder: Secondary | ICD-10-CM | POA: Diagnosis not present

## 2024-05-22 DIAGNOSIS — Z136 Encounter for screening for cardiovascular disorders: Secondary | ICD-10-CM | POA: Diagnosis not present

## 2024-05-22 DIAGNOSIS — Z130 Encounter for screening for diseases of the blood and blood-forming organs and certain disorders involving the immune mechanism: Secondary | ICD-10-CM

## 2024-05-22 DIAGNOSIS — Z131 Encounter for screening for diabetes mellitus: Secondary | ICD-10-CM

## 2024-05-22 LAB — BASIC METABOLIC PANEL WITHOUT GFR
BUN: 18 mg/dL (ref 7–25)
CO2: 30 mmol/L (ref 20–32)
Calcium: 9.7 mg/dL (ref 8.6–10.4)
Chloride: 103 mmol/L (ref 98–110)
Creat: 0.83 mg/dL (ref 0.50–1.05)
Glucose, Bld: 90 mg/dL (ref 65–99)
Potassium: 4.2 mmol/L (ref 3.5–5.3)
Sodium: 140 mmol/L (ref 135–146)

## 2024-05-22 LAB — LIPID PANEL
Cholesterol: 221 mg/dL — ABNORMAL HIGH (ref 28–200)
HDL: 84.2 mg/dL
LDL Cholesterol: 124 mg/dL — ABNORMAL HIGH (ref 10–99)
NonHDL: 137.21
Total CHOL/HDL Ratio: 3
Triglycerides: 64 mg/dL (ref 10.0–149.0)
VLDL: 12.8 mg/dL (ref 0.0–40.0)

## 2024-05-22 LAB — HEPATIC FUNCTION PANEL
ALT: 40 U/L — ABNORMAL HIGH (ref 3–35)
AST: 48 U/L — ABNORMAL HIGH (ref 5–37)
Albumin: 4.4 g/dL (ref 3.5–5.2)
Alkaline Phosphatase: 53 U/L (ref 39–117)
Bilirubin, Direct: 0 mg/dL — ABNORMAL LOW (ref 0.1–0.3)
Total Bilirubin: 0.4 mg/dL (ref 0.2–1.2)
Total Protein: 6.7 g/dL (ref 6.0–8.3)

## 2024-05-22 NOTE — Patient Instructions (Signed)
 Health Maintenance, Female Adopting a healthy lifestyle and getting preventive care are important in promoting health and wellness. Ask your health care provider about: The right schedule for you to have regular tests and exams. Things you can do on your own to prevent diseases and keep yourself healthy. What should I know about diet, weight, and exercise? Eat a healthy diet  Eat a diet that includes plenty of vegetables, fruits, low-fat dairy products, and lean protein. Do not eat a lot of foods that are high in solid fats, added sugars, or sodium. Maintain a healthy weight Body mass index (BMI) is used to identify weight problems. It estimates body fat based on height and weight. Your health care provider can help determine your BMI and help you achieve or maintain a healthy weight. Get regular exercise Get regular exercise. This is one of the most important things you can do for your health. Most adults should: Exercise for at least 150 minutes each week. The exercise should increase your heart rate and make you sweat (moderate-intensity exercise). Do strengthening exercises at least twice a week. This is in addition to the moderate-intensity exercise. Spend less time sitting. Even light physical activity can be beneficial. Watch cholesterol and blood lipids Have your blood tested for lipids and cholesterol at 66 years of age, then have this test every 5 years. Have your cholesterol levels checked more often if: Your lipid or cholesterol levels are high. You are older than 66 years of age. You are at high risk for heart disease. What should I know about cancer screening? Depending on your health history and family history, you may need to have cancer screening at various ages. This may include screening for: Breast cancer. Cervical cancer. Colorectal cancer. Skin cancer. Lung cancer. What should I know about heart disease, diabetes, and high blood pressure? Blood pressure and heart  disease High blood pressure causes heart disease and increases the risk of stroke. This is more likely to develop in people who have high blood pressure readings or are overweight. Have your blood pressure checked: Every 3-5 years if you are 32-37 years of age. Every year if you are 71 years old or older. Diabetes Have regular diabetes screenings. This checks your fasting blood sugar level. Have the screening done: Once every three years after age 24 if you are at a normal weight and have a low risk for diabetes. More often and at a younger age if you are overweight or have a high risk for diabetes. What should I know about preventing infection? Hepatitis B If you have a higher risk for hepatitis B, you should be screened for this virus. Talk with your health care provider to find out if you are at risk for hepatitis B infection. Hepatitis C Testing is recommended for: Everyone born from [birth year] through 1967. Anyone with known risk factors for hepatitis C. Sexually transmitted infections (STIs) Get screened for STIs, including gonorrhea and chlamydia, if: You are sexually active and are younger than 66 years of age. You are older than 66 years of age and your health care provider tells you that you are at risk for this type of infection. Your sexual activity has changed since you were last screened, and you are at increased risk for chlamydia or gonorrhea. Ask your health care provider if you are at risk. Ask your health care provider about whether you are at high risk for HIV. Your health care provider may recommend a prescription medicine to help prevent HIV  infection. If you choose to take medicine to prevent HIV, you should first get tested for HIV. You should then be tested every 3 months for as long as you are taking the medicine. Pregnancy If you are about to stop having your period (premenopausal) and you may become pregnant, seek counseling before you get pregnant. Take 400 to 800  micrograms (mcg) of folic acid every day if you become pregnant. Ask for birth control (contraception) if you want to prevent pregnancy. Osteoporosis and menopause Osteoporosis is a disease in which the bones lose minerals and strength with aging. This can result in bone fractures. If you are 42 years old or older, or if you are at risk for osteoporosis and fractures, ask your health care provider if you should: Be screened for bone loss. Take a calcium  or vitamin D  supplement to lower your risk of fractures. Be given hormone replacement therapy (HRT) to treat symptoms of menopause. Follow these instructions at home: Alcohol use Do not drink alcohol if: Your health care provider tells you not to drink. You are pregnant, may be pregnant, or are planning to become pregnant. If you drink alcohol: Limit how much you have to: 0-1 drink a day. Know how much alcohol is in your drink. In the [place] one drink equals one 12 oz bottle of beer (355 mL), one 5 oz glass of wine (148 mL), or one 1 oz glass of hard liquor (44 mL). Lifestyle Do not use any products that contain nicotine or tobacco. These products include cigarettes, chewing tobacco, and vaping devices, such as e-cigarettes. If you need help quitting, ask your health care provider. Do not use street drugs. Do not share needles. Ask your health care provider for help if you need support or information about quitting drugs. General instructions Schedule regular health, dental, and eye exams. Stay current with your vaccines. Tell your health care provider if: You often feel depressed. You have ever been abused or do not feel safe at home. This information is not intended to replace advice given to you by your health care provider. Make sure you discuss any questions you have with your health care provider. Document Revised: 10/17/2023 Document Reviewed: 08/30/2020 Elsevier Patient Education  2025 [employer].

## 2024-05-22 NOTE — Progress Notes (Signed)
 Wt Readings from Last 3 Encounters:  05/22/24 161 lb (73 kg)  04/10/24 160 lb (72.6 kg)  08/22/23 169 lb (76.7 kg)   BP Readings from Last 3 Encounters:  04/10/24 102/64  08/22/23 112/64  08/02/23 (!) 130/53     Chief Complaint  Patient presents with   Welcome to Medicare     Subjective:   Julie Hopkins is a 64 y.o. female who presents for a Welcome to [employer] Exam.   Visit info / Clinical Intake: Medicare Wellness Visit Type:: Welcome to [employer] GOVERNMENT SOCIAL RESEARCH OFFICER) Persons participating in visit and providing information:: patient Medicare Wellness Visit Mode:: In-person (required for WTM) Interpreter Needed?: No Pre-visit prep was completed: yes AWV questionnaire completed by patient prior to visit?: no  Functional Status Activities of Daily Living (to include ambulation/medication): Independent Ambulation: Independent Medication Administration: Independent Home Management (perform basic housework or laundry): Independent Manage your own finances?: yes Concerns about vision?: no *vision screening is required for WTM* Concerns about hearing?: no  Fall Screening Falls in the past year?: 0 Number of falls in past year: 0 Was there an injury with Fall?: 0 Fall Risk Category Calculator: 0 Patient Fall Risk Level: Low Fall Risk  Fall Risk Patient at Risk for Falls Due to: No Fall Risks Fall risk Follow up: Falls evaluation completed  Home and Transportation Safety: All rugs have non-skid backing?: yes All stairs or steps have railings?: (!) no Grab bars in the bathtub or shower?: (!) no Have non-skid surface in bathtub or shower?: (!) no Good home lighting?: yes Hospital stays in the last year:: no  Cognitive Assessment Difficulty concentrating, remembering, or making decisions? : no Will 6CIT or Mini Cog be Completed: no 6CIT or Mini Cog Declined: patient declined  Advance Directives (For Healthcare) Does Patient Have a Medical Advance Directive?: Yes Does  patient want to make changes to medical advance directive?: Yes (ED - send information to MyChart) Type of Advance Directive: Healthcare Power of [place]; Living will    Allergies (verified) Morphine sulfate and Sulfa antibiotics   Current Medications (verified) Outpatient Encounter Medications as of 05/22/2024  Medication Sig   buPROPion  (WELLBUTRIN  XL) 300 MG 24 hr tablet Take 1 tablet (300 mg total) by mouth daily.   Calcium -Phosphorus-Vitamin D  (CALCIUM /VITAMIN D3/ADULT GUMMY) 250-100-500 MG-MG-UNIT CHEW Chew 1 Units by mouth daily.   estradiol  (ESTRACE ) 0.5 MG tablet Take 1 tablet (0.5 mg total) by mouth daily.   ibuprofen (ADVIL) 600 MG tablet Take 600 mg by mouth every 8 (eight) hours as needed for mild pain.   loratadine (CLARITIN) 10 MG tablet Take 10 mg by mouth daily.   Multiple Vitamin (MULTIVITAMIN) tablet Take 1 tablet by mouth daily.   valACYclovir  (VALTREX ) 500 MG tablet TAKE ONE TABLET BY MOUTH DAILY, INCREASE TO ONE TABLET TWICE A DAY for 3 days prn OUTBREAK   No facility-administered encounter medications on file as of 05/22/2024.    History: Past Medical History:  Diagnosis Date   Anemia    Depression    GERD (gastroesophageal reflux disease)    Heart murmur    Post-operative nausea and vomiting    Past Surgical History:  Procedure Laterality Date   ABDOMINAL HYSTERECTOMY  2005   COLONOSCOPY  11/08/2018   GYNECOLOGIC CRYOSURGERY     MYOMECTOMY     SALPINGOOPHORECTOMY     both at 2 different surgeries   UPPER GASTROINTESTINAL ENDOSCOPY  11/08/2018   WISDOM TOOTH EXTRACTION     Family History  Problem Relation Age  of Onset   Cancer Mother    COPD Mother    Early death Mother    Heart disease Mother    Miscarriages / Stillbirths Mother    Alcohol abuse Father    Depression Father    Alcohol abuse Brother    Early death Brother    Heart attack Brother    Hypertension Brother    Alcohol abuse Brother    Depression Brother    Heart disease Brother     Breast cancer Paternal Aunt    Uterine cancer Paternal Grandmother    Early death Paternal Grandfather    Depression Daughter    Colon cancer Neg Hx    Pancreatic cancer Neg Hx    Rectal cancer Neg Hx    Stomach cancer Neg Hx    Esophageal cancer Neg Hx    Colon polyps Neg Hx    Social History   Occupational History   Occupation: Retired  Tobacco Use   Smoking status: Former    Current packs/day: 0.00    Types: Cigarettes    Quit date: 02/07/1998    Years since quitting: 26.3   Smokeless tobacco: Never  Vaping Use   Vaping status: Never Used  Substance and Sexual Activity   Alcohol use: Yes    Alcohol/week: 1.0 - 2.0 standard drink of alcohol    Types: 1 - 2 Glasses of wine per week    Comment: occasionally   Drug use: Never   Sexual activity: Not Currently    Birth control/protection: Post-menopausal   Tobacco Counseling Counseling given: Not Answered  SDOH Screenings   Depression (PHQ2-9): Low Risk (08/22/2023)  Tobacco Use: Medium Risk (05/22/2024)   See flowsheets for full screening details  Depression Screen PHQ 2 & 9 Depression Scale- Over the past 2 weeks, how often have you been bothered by any of the following problems? Little interest or pleasure in doing things: 0 Feeling down, depressed, or hopeless (PHQ Adolescent also includes...irritable): 0 PHQ-2 Total Score: 0      Goals Addressed   None          Objective:    Today's Vitals   05/22/24 1455  BP: 122/84  Pulse: 89  Temp: 98.1 F (36.7 C)  TempSrc: Oral  SpO2: 98%  Weight: 161 lb (73 kg)  Height: 5' 7.25 (1.708 m)   Body mass index is 25.03 kg/m.   Physical Exam Vitals reviewed.  Constitutional:      Appearance: Normal appearance.  HENT:     Head: Normocephalic.     Right Ear: Tympanic membrane, ear canal and external ear normal.     Left Ear: Tympanic membrane, ear canal and external ear normal.     Mouth/Throat:     Mouth: Mucous membranes are moist.     Pharynx:  Oropharynx is clear.  Eyes:     Extraocular Movements: Extraocular movements intact.     Conjunctiva/sclera: Conjunctivae normal.     Pupils: Pupils are equal, round, and reactive to light.  Cardiovascular:     Rate and Rhythm: Normal rate and regular rhythm.     Pulses: Normal pulses.     Heart sounds: Normal heart sounds.  Pulmonary:     Effort: Pulmonary effort is normal.     Breath sounds: Normal breath sounds.  Abdominal:     Palpations: Abdomen is soft.     Tenderness: There is no abdominal tenderness.  Musculoskeletal:     Cervical back: No tenderness.  Right lower leg: No edema.     Left lower leg: No edema.  Lymphadenopathy:     Cervical: No cervical adenopathy.  Skin:    General: Skin is warm and dry.     Capillary Refill: Capillary refill takes less than 2 seconds.  Neurological:     General: No focal deficit present.     Mental Status: She is alert and oriented to person, place, and time.  Psychiatric:        Mood and Affect: Mood normal.        Behavior: Behavior normal.    Physical Exam Vitals reviewed.  Constitutional:      Appearance: Normal appearance.  HENT:     Head: Normocephalic.     Right Ear: Tympanic membrane, ear canal and external ear normal.     Left Ear: Tympanic membrane, ear canal and external ear normal.     Mouth/Throat:     Mouth: Mucous membranes are moist.     Pharynx: Oropharynx is clear.  Eyes:     Extraocular Movements: Extraocular movements intact.     Conjunctiva/sclera: Conjunctivae normal.     Pupils: Pupils are equal, round, and reactive to light.  Cardiovascular:     Rate and Rhythm: Normal rate and regular rhythm.     Pulses: Normal pulses.     Heart sounds: Normal heart sounds.  Pulmonary:     Effort: Pulmonary effort is normal.     Breath sounds: Normal breath sounds.  Abdominal:     Palpations: Abdomen is soft.     Tenderness: There is no abdominal tenderness.  Musculoskeletal:     Cervical back: No  tenderness.     Right lower leg: No edema.     Left lower leg: No edema.  Lymphadenopathy:     Cervical: No cervical adenopathy.  Skin:    General: Skin is warm and dry.     Capillary Refill: Capillary refill takes less than 2 seconds.  Neurological:     General: No focal deficit present.     Mental Status: She is alert and oriented to person, place, and time.  Psychiatric:        Mood and Affect: Mood normal.        Behavior: Behavior normal.       Hearing/Vision screen Vision Screening   Right eye Left eye Both eyes  Without correction 20/13 20/15 20/20   With correction      Immunizations and Health Maintenance Health Maintenance  Topic Date Due   Zoster Vaccines- Shingrix (1 of 2) Never done   Pneumococcal Vaccine: 50+ Years (1 of 1 - PCV) Never done   Cervical Cancer Screening (HPV/Pap Cotest)  03/03/2024   COVID-19 Vaccine (9 - Pfizer risk 2025-26 season) 07/02/2024   Medicare Annual Wellness (AWV)  05/22/2025   Mammogram  02/13/2026   DTaP/Tdap/Td (2 - Td or Tdap) 09/30/2028   Colonoscopy  11/07/2028   Influenza Vaccine  Completed   Bone Density Scan  Completed   Hepatitis C Screening  Completed   HIV Screening  Completed   Hepatitis B Vaccines 19-59 Average Risk  Aged Out   Meningococcal B Vaccine  Aged Out    EKG: normal EKG, normal sinus rhythm, unchanged from previous tracings     Assessment/Plan:  This is a routine wellness examination for Ligia.  Patient Care Team: Purcell Emil Schanz, MD as PCP - General (Internal Medicine) Jeffrie Oneil BROCKS, MD as PCP - Cardiology (Cardiology)  I have personally  reviewed and noted the following in the patients chart:   Medical and social history Use of alcohol, tobacco or illicit drugs  Current medications and supplements including opioid prescriptions. Functional ability and status Nutritional status Physical activity Advanced directives List of other physicians Hospitalizations, surgeries, and ER visits  in previous 12 months Vitals Screenings to include cognitive, depression, and falls Referrals and appointments  Orders Placed This Encounter  Procedures   Basic Metabolic Panel w/GFR   Hepatic function panel   Lipid panel   EKG 12-Lead   In addition, I have reviewed and discussed with patient certain preventive protocols, quality metrics, and best practice recommendations. A written personalized care plan for preventive services as well as general preventive health recommendations were provided to patient.   Brock Hall, [place]   05/22/2024   Return in 1 year (on 05/22/2025).

## 2024-05-23 ENCOUNTER — Ambulatory Visit: Payer: Self-pay | Admitting: Emergency Medicine

## 2024-05-23 DIAGNOSIS — R748 Abnormal levels of other serum enzymes: Secondary | ICD-10-CM

## 2024-05-27 NOTE — Telephone Encounter (Signed)
 Please advise.

## 2024-05-28 ENCOUNTER — Ambulatory Visit: Payer: Self-pay | Admitting: Emergency Medicine

## 2024-05-28 ENCOUNTER — Inpatient Hospital Stay: Admission: RE | Admit: 2024-05-28 | Discharge: 2024-05-28 | Attending: Emergency Medicine

## 2024-05-28 DIAGNOSIS — R748 Abnormal levels of other serum enzymes: Secondary | ICD-10-CM

## 2024-07-29 ENCOUNTER — Encounter: Admitting: Emergency Medicine

## 2024-09-08 ENCOUNTER — Ambulatory Visit: Admitting: Obstetrics and Gynecology
